# Patient Record
Sex: Female | Born: 1960 | Race: White | Hispanic: No | Smoking: Current every day smoker
Health system: Southern US, Community
[De-identification: ages and names within clinical notes are randomized; demographics above are authoritative.]

## PROBLEM LIST (undated history)

## (undated) DIAGNOSIS — Z87442 Personal history of urinary calculi: Secondary | ICD-10-CM

## (undated) DIAGNOSIS — N83209 Unspecified ovarian cyst, unspecified side: Secondary | ICD-10-CM

## (undated) DIAGNOSIS — L659 Nonscarring hair loss, unspecified: Secondary | ICD-10-CM

## (undated) DIAGNOSIS — T7840XA Allergy, unspecified, initial encounter: Secondary | ICD-10-CM

## (undated) DIAGNOSIS — M1990 Unspecified osteoarthritis, unspecified site: Secondary | ICD-10-CM

## (undated) DIAGNOSIS — N951 Menopausal and female climacteric states: Secondary | ICD-10-CM

## (undated) DIAGNOSIS — N809 Endometriosis, unspecified: Secondary | ICD-10-CM

## (undated) HISTORY — DX: Endometriosis, unspecified: N80.9

## (undated) HISTORY — DX: Allergy, unspecified, initial encounter: T78.40XA

## (undated) HISTORY — PX: BILATERAL OOPHORECTOMY: SHX1221

## (undated) HISTORY — PX: APPENDECTOMY: SHX54

## (undated) HISTORY — DX: Nonscarring hair loss, unspecified: L65.9

## (undated) HISTORY — DX: Personal history of urinary calculi: Z87.442

## (undated) HISTORY — DX: Unspecified ovarian cyst, unspecified side: N83.209

## (undated) HISTORY — PX: ENDOMETRIAL ABLATION: SHX621

## (undated) HISTORY — DX: Menopausal and female climacteric states: N95.1

## (undated) HISTORY — DX: Unspecified osteoarthritis, unspecified site: M19.90

## (undated) HISTORY — PX: WISDOM TOOTH EXTRACTION: SHX21

---

## 1994-04-10 HISTORY — PX: LAPAROSCOPIC HYSTERECTOMY: SHX1926

## 1999-08-16 ENCOUNTER — Other Ambulatory Visit: Admission: RE | Admit: 1999-08-16 | Discharge: 1999-08-16 | Payer: Self-pay | Admitting: Obstetrics & Gynecology

## 2002-07-14 ENCOUNTER — Encounter: Payer: Self-pay | Admitting: Emergency Medicine

## 2002-07-14 ENCOUNTER — Emergency Department (HOSPITAL_COMMUNITY): Admission: EM | Admit: 2002-07-14 | Discharge: 2002-07-14 | Payer: Self-pay | Admitting: *Deleted

## 2003-12-31 ENCOUNTER — Other Ambulatory Visit: Admission: RE | Admit: 2003-12-31 | Discharge: 2003-12-31 | Payer: Self-pay | Admitting: Obstetrics & Gynecology

## 2007-10-29 ENCOUNTER — Encounter: Admission: RE | Admit: 2007-10-29 | Discharge: 2007-10-29 | Payer: Self-pay | Admitting: Family Medicine

## 2009-01-21 ENCOUNTER — Emergency Department (HOSPITAL_COMMUNITY): Admission: EM | Admit: 2009-01-21 | Discharge: 2009-01-21 | Payer: Self-pay | Admitting: Family Medicine

## 2011-01-13 ENCOUNTER — Other Ambulatory Visit: Payer: Self-pay | Admitting: Obstetrics & Gynecology

## 2011-01-13 DIAGNOSIS — R928 Other abnormal and inconclusive findings on diagnostic imaging of breast: Secondary | ICD-10-CM

## 2011-01-27 ENCOUNTER — Ambulatory Visit
Admission: RE | Admit: 2011-01-27 | Discharge: 2011-01-27 | Disposition: A | Discharge status: Home / Self Care | Payer: PRIVATE HEALTH INSURANCE | Source: Ambulatory Visit | Attending: Obstetrics & Gynecology | Admitting: Obstetrics & Gynecology

## 2011-01-27 DIAGNOSIS — R928 Other abnormal and inconclusive findings on diagnostic imaging of breast: Secondary | ICD-10-CM

## 2014-03-18 ENCOUNTER — Other Ambulatory Visit: Payer: Self-pay | Admitting: Obstetrics & Gynecology

## 2014-03-18 DIAGNOSIS — R928 Other abnormal and inconclusive findings on diagnostic imaging of breast: Secondary | ICD-10-CM

## 2014-03-25 ENCOUNTER — Ambulatory Visit
Admission: RE | Admit: 2014-03-25 | Discharge: 2014-03-25 | Disposition: A | Discharge status: Home / Self Care | Payer: BC Managed Care – PPO | Source: Ambulatory Visit | Attending: Obstetrics & Gynecology | Admitting: Obstetrics & Gynecology

## 2014-03-25 DIAGNOSIS — R928 Other abnormal and inconclusive findings on diagnostic imaging of breast: Secondary | ICD-10-CM

## 2014-04-07 ENCOUNTER — Other Ambulatory Visit: Payer: PRIVATE HEALTH INSURANCE

## 2014-06-24 ENCOUNTER — Other Ambulatory Visit: Payer: Self-pay | Admitting: Family Medicine

## 2014-06-24 ENCOUNTER — Ambulatory Visit
Admission: RE | Admit: 2014-06-24 | Discharge: 2014-06-24 | Disposition: A | Discharge status: Home / Self Care | Payer: Self-pay | Source: Ambulatory Visit | Attending: Family Medicine | Admitting: Family Medicine

## 2014-06-24 DIAGNOSIS — R059 Cough, unspecified: Secondary | ICD-10-CM

## 2014-06-24 DIAGNOSIS — R05 Cough: Secondary | ICD-10-CM

## 2020-09-15 LAB — COLOGUARD
COLOGUARD: NEGATIVE
Cologuard: NEGATIVE

## 2021-07-06 ENCOUNTER — Telehealth: Payer: Self-pay | Admitting: Internal Medicine

## 2021-07-06 NOTE — Telephone Encounter (Signed)
That is okay, please advise that she needs to keep a gynecologist ?

## 2021-07-06 NOTE — Telephone Encounter (Signed)
Pt is the partner of Greer Ee and would like to establish care with Dr.Paz.  ?

## 2021-07-06 NOTE — Telephone Encounter (Signed)
Please advise 

## 2021-07-07 LAB — HM MAMMOGRAPHY

## 2021-07-07 NOTE — Telephone Encounter (Signed)
Lvm to call back

## 2021-07-20 DIAGNOSIS — R6882 Decreased libido: Secondary | ICD-10-CM | POA: Insufficient documentation

## 2021-07-20 DIAGNOSIS — N951 Menopausal and female climacteric states: Secondary | ICD-10-CM | POA: Insufficient documentation

## 2021-07-21 ENCOUNTER — Other Ambulatory Visit: Payer: Self-pay

## 2021-08-04 ENCOUNTER — Encounter: Payer: Self-pay | Admitting: Internal Medicine

## 2021-08-04 ENCOUNTER — Ambulatory Visit: Payer: BC Managed Care – PPO | Admitting: Internal Medicine

## 2021-08-04 VITALS — BP 108/64 | HR 68 | Temp 98.1°F | Resp 16 | Ht 64.5 in | Wt 132.4 lb

## 2021-08-04 DIAGNOSIS — Z8249 Family history of ischemic heart disease and other diseases of the circulatory system: Secondary | ICD-10-CM

## 2021-08-04 DIAGNOSIS — Z0000 Encounter for general adult medical examination without abnormal findings: Secondary | ICD-10-CM | POA: Diagnosis not present

## 2021-08-04 DIAGNOSIS — Z09 Encounter for follow-up examination after completed treatment for conditions other than malignant neoplasm: Secondary | ICD-10-CM | POA: Insufficient documentation

## 2021-08-04 LAB — LIPID PANEL
Cholesterol: 169 mg/dL (ref 0–200)
HDL: 61.4 mg/dL (ref >39.00)
LDL Cholesterol: 68 mg/dL (ref 0–99)
NonHDL: 107.39
Total CHOL/HDL Ratio: 3
Triglycerides: 195 mg/dL — ABNORMAL HIGH (ref 0.0–149.0)
VLDL: 39 mg/dL (ref 0.0–40.0)

## 2021-08-04 LAB — CBC WITH DIFFERENTIAL/PLATELET
Basophils Absolute: 0.1 10*3/uL (ref 0.0–0.1)
Basophils Relative: 0.9 % (ref 0.0–3.0)
Eosinophils Absolute: 0.1 10*3/uL (ref 0.0–0.7)
Eosinophils Relative: 0.9 % (ref 0.0–5.0)
HCT: 40.8 % (ref 36.0–46.0)
Hemoglobin: 13.7 g/dL (ref 12.0–15.0)
Lymphocytes Relative: 35.5 % (ref 12.0–46.0)
Lymphs Abs: 2.1 10*3/uL (ref 0.7–4.0)
MCHC: 33.6 g/dL (ref 30.0–36.0)
MCV: 92.2 fl (ref 78.0–100.0)
Monocytes Absolute: 0.4 10*3/uL (ref 0.1–1.0)
Monocytes Relative: 6.8 % (ref 3.0–12.0)
Neutro Abs: 3.4 10*3/uL (ref 1.4–7.7)
Neutrophils Relative %: 55.9 % (ref 43.0–77.0)
Platelets: 265 10*3/uL (ref 150.0–400.0)
RBC: 4.42 Mil/uL (ref 3.87–5.11)
RDW: 13.5 % (ref 11.5–15.5)
WBC: 6 10*3/uL (ref 4.0–10.5)

## 2021-08-04 LAB — COMPREHENSIVE METABOLIC PANEL
ALT: 19 U/L (ref 0–35)
AST: 21 U/L (ref 0–37)
Albumin: 4.4 g/dL (ref 3.5–5.2)
Alkaline Phosphatase: 45 U/L (ref 39–117)
BUN: 12 mg/dL (ref 6–23)
CO2: 29 mEq/L (ref 19–32)
Calcium: 9.4 mg/dL (ref 8.4–10.5)
Chloride: 101 mEq/L (ref 96–112)
Creatinine, Ser: 0.58 mg/dL (ref 0.40–1.20)
GFR: 97 mL/min (ref >60.00)
Glucose, Bld: 89 mg/dL (ref 70–99)
Potassium: 4.4 mEq/L (ref 3.5–5.1)
Sodium: 137 mEq/L (ref 135–145)
Total Bilirubin: 0.4 mg/dL (ref 0.2–1.2)
Total Protein: 7 g/dL (ref 6.0–8.3)

## 2021-08-04 LAB — TSH: TSH: 1.81 u[IU]/mL (ref 0.35–5.50)

## 2021-08-04 NOTE — Assessment & Plan Note (Signed)
Td 2016 ?S/p shingrix x 2 ?Covid vax utd ?Last female exam  06-2021. ?Mammogram 07-07-21: Normal ?CCS: ?Cscope at age 61: no polyps per pt, no report   ?Cologuard : NEG 09/2020 ?Diet, exercise: She is active, takes walks regularly.  Counseled about diet ?ACP info provided  ?Labs: CMP, FLP, CBC, TSH  ?  ?

## 2021-08-04 NOTE — Telephone Encounter (Signed)
Copy of form sent for scanning,.  ?

## 2021-08-04 NOTE — Assessment & Plan Note (Signed)
-  New patient, here for CPX. ?-She brought records, they were reviewed. ?Decreased libido: On testosterone per gynecology ?+ FH CAD: ?Mother had CAD late in life, father had heart attack at age 63.  The patient is active without chest pain but is a smoker. ?Does not recall any heart testing previously. ?EKG today: NSR ?D/w pt Coronary Ca Score: will proceed ?Tobacco abuse: Has tried to quit tobacco many times, could not tolerate Chantix.  Counseled. ?Stress: Work-related, very seldom takes Xanax ?RTC CPX 1 year  ?

## 2021-08-04 NOTE — Progress Notes (Signed)
? ?Subjective:  ? ? Patient ID: Kendra Shaw, female    DOB: 15-Jul-1960, 61 y.o.   MRN: [id number] ? ?DOS:  08/04/2021 ?Type of visit - description: new patient, CPX ? ?Here for CPX ?In general feels well. ?She did have COVID over a year ago, reports symptoms were severe and  since then she has slightly fatigue. ?Admits to occasional cough mostly allergy related. ?Denies nausea vomiting.  No blood in the stools. ?Occasional GERD, takes OTCs. ? ? ?Review of Systems ? ?Other than above, a 14 point review of systems is negative  ? ?  ? ?Past Medical History:  ?Diagnosis Date  ? Allergy   ? Alopecia   ? Arthritis   ? Endometriosis   ? History of kidney stones   ? Menopausal symptoms   ? Ovarian cyst   ? ? ?Past Surgical History:  ?Procedure Laterality Date  ? APPENDECTOMY    ? BILATERAL OOPHORECTOMY    ? ENDOMETRIAL ABLATION    ? LAPAROSCOPIC HYSTERECTOMY  1996  ? WISDOM TOOTH EXTRACTION    ? ?Social History  ? ?Socioeconomic History  ? Marital status: Media planner  ?  Spouse name: Not on file  ? Number of children: 0  ? Years of education: Not on file  ? Highest education level: Not on file  ?Occupational History  ? Occupation: part time- [employer]  ?Tobacco Use  ? Smoking status: Every Day  ?  Packs/day: 0.75  ?  Years: 34.00  ?  Pack years: 25.50  ?  Types: Cigarettes  ? Smokeless tobacco: Never  ?Substance and Sexual Activity  ? Alcohol use: Yes  ?  Comment: socially  ? Drug use: Not on file  ? Sexual activity: Not on file  ?Other Topics Concern  ? Not on file  ?Social History Narrative  ? Lives w/ significant other   ? ?Social Determinants of Health  ? ?Financial Resource Strain: Not on file  ?Food Insecurity: Not on file  ?Transportation Needs: Not on file  ?Physical Activity: Not on file  ?Stress: Not on file  ?Social Connections: Not on file  ?Intimate Partner Violence: Not on file  ? ? ?Current Outpatient Medications  ?Medication Instructions  ? albuterol (VENTOLIN HFA) 108 (90 Base) MCG/ACT inhaler 2 puffs,  Every 4 hours PRN  ? ALPRAZolam (XANAX) 0.125-0.25 mg, Oral, Daily PRN  ? cetirizine (ZYRTEC) 10 mg, Oral, Daily  ? estradiol (ESTRACE) 2 mg, Oral, Daily  ? guaiFENesin (MUCINEX) 600 MG 12 hr tablet Oral  ? Multiple Vitamins-Minerals (AIRBORNE GUMMIES PO) Oral  ? Probiotic Product (PROBIOTIC DAILY PO) Probiotic  ? Probiotic Product (PROBIOTIC DAILY PO) Oral  ? testosterone cypionate (DEPOTESTOSTERONE CYPIONATE) 200 MG/ML injection   ? triamcinolone cream (KENALOG) 0.1 % 1 application., 2 times daily  ? ? ?   ?Objective:  ? Physical Exam ?BP 108/64 (BP Location: Left Arm, Patient Position: Sitting, Cuff Size: Small)   Pulse 68   Temp 98.1 ?F (36.7 ?C) (Oral)   Resp 16   Ht 5' 4.5" (1.638 m)   Wt 132 lb 6 oz (60 kg)   SpO2 97%   BMI 22.37 kg/m?  ?General: ?Well developed, NAD, BMI noted ?Neck: No  thyromegaly  ?HEENT:  ?Normocephalic . Face symmetric, atraumatic ?Lungs:  ?CTA B ?Normal respiratory effort, no intercostal retractions, no accessory muscle use. ?Heart: RRR,  no murmur.  ?Abdomen:  ?Not distended, soft, non-tender. No rebound or rigidity.   ?Lower extremities: no pretibial edema bilaterally  ?Skin:  Exposed areas without rash. Not pale. Not jaundice ?Neurologic:  ?alert & oriented X3.  ?Speech normal, gait appropriate for age and unassisted ?Strength symmetric and appropriate for age.  ?Psych: ?Cognition and judgment appear intact.  ?Cooperative with normal attention span and concentration.  ?Behavior appropriate. ?No anxious or depressed appearing. ? ?   ?Assessment   ? ?Assessment (new patient 07-2021, referred by Leeann Must). ?Decreased libido ?Endometriosis, hysterectomy, B oophorectomy ?+FH CAD ? ?PLAN ?-New patient, here for CPX. ?-She brought records, they were reviewed. ?Decreased libido: On testosterone per gynecology ?+ FH CAD: ?Mother had CAD late in life, father had heart attack at age 46.  The patient is active without chest pain but is a smoker. ?Does not recall any heart testing  previously. ?EKG today: NSR ?D/w pt Coronary Ca Score: will proceed ?Tobacco abuse: Has tried to quit tobacco many times, could not tolerate Chantix.  Counseled. ?Stress: Work-related, very seldom takes Xanax ?RTC CPX 1 year  ? ? ?

## 2021-08-04 NOTE — Patient Instructions (Signed)
   GO TO THE LAB : Get the blood work     GO TO THE FRONT DESK, PLEASE SCHEDULE YOUR APPOINTMENTS Come back for   a physical exam in 1 year   "Living will", "Health Care Power of attorney": Advanced care planning  (If you already have a living will or healthcare power of attorney, please bring the copy to be scanned in your chart.)  Advance care planning is a process that supports adults in  understanding and sharing their preferences regarding future medical care.   The patient's preferences are recorded in documents called Advance Directives.    Advanced directives are completed (and can be modified at any time) while the patient is in full mental capacity.   The documentation should be available at all times to the patient, the family and the healthcare providers.  Bring in a copy to be scanned in your chart is an excellent idea and is recommended   This legal documents direct treatment decision making and/or appoint a surrogate to make the decision if the patient is not capable to do so.    Advance directives can be documented in many types of formats,  documents have names such as:  Lliving will  Durable power of attorney for healthcare (healthcare proxy or healthcare power of attorney)  Combined directives  Physician orders for life-sustaining treatment    More information at:  [email or web address]  

## 2021-08-25 ENCOUNTER — Ambulatory Visit (HOSPITAL_BASED_OUTPATIENT_CLINIC_OR_DEPARTMENT_OTHER)
Admission: RE | Admit: 2021-08-25 | Discharge: 2021-08-25 | Disposition: A | Discharge status: Home / Self Care | Payer: BC Managed Care – PPO | Source: Ambulatory Visit | Attending: Internal Medicine | Admitting: Internal Medicine

## 2021-08-25 DIAGNOSIS — Z8249 Family history of ischemic heart disease and other diseases of the circulatory system: Secondary | ICD-10-CM | POA: Insufficient documentation

## 2021-08-29 ENCOUNTER — Telehealth: Payer: Self-pay | Admitting: Internal Medicine

## 2021-08-29 DIAGNOSIS — R911 Solitary pulmonary nodule: Secondary | ICD-10-CM

## 2021-08-29 DIAGNOSIS — F17200 Nicotine dependence, unspecified, uncomplicated: Secondary | ICD-10-CM

## 2021-08-29 NOTE — Telephone Encounter (Signed)
Arrange a noncontrast chest CT, DX lung nodule, smoker. === Spoke with the patient today regards results: - Lung nodule, recommend to proceed with a dedicated CT chest, need to rule out a malignancy, she agreed. - Calcium coronary score 8, 66% percentile, last LDL 68.  Recommend to quit tobacco, discussed statins, she will think about it

## 2021-08-29 NOTE — Telephone Encounter (Signed)
CT ordered. 

## 2021-09-07 ENCOUNTER — Ambulatory Visit (HOSPITAL_BASED_OUTPATIENT_CLINIC_OR_DEPARTMENT_OTHER)
Admission: RE | Admit: 2021-09-07 | Discharge: 2021-09-07 | Disposition: A | Discharge status: Home / Self Care | Payer: BC Managed Care – PPO | Source: Ambulatory Visit | Attending: Internal Medicine | Admitting: Internal Medicine

## 2021-09-07 DIAGNOSIS — F17200 Nicotine dependence, unspecified, uncomplicated: Secondary | ICD-10-CM | POA: Diagnosis present

## 2021-09-07 DIAGNOSIS — R911 Solitary pulmonary nodule: Secondary | ICD-10-CM | POA: Diagnosis present

## 2021-09-12 NOTE — Addendum Note (Signed)
Addended by: Conrad  D on: 09/12/2021 08:00 AM   Modules accepted: Orders

## 2021-10-06 ENCOUNTER — Ambulatory Visit: Payer: BC Managed Care – PPO | Admitting: Emergency Medicine

## 2021-10-06 ENCOUNTER — Encounter: Payer: Self-pay | Admitting: Emergency Medicine

## 2021-10-06 DIAGNOSIS — R911 Solitary pulmonary nodule: Secondary | ICD-10-CM

## 2021-10-06 NOTE — Addendum Note (Signed)
Addended by: Jaynee Eagles on: 10/06/2021 10:36 AM   Modules accepted: Orders

## 2021-10-06 NOTE — Progress Notes (Signed)
Subjective:    Patient ID: Kendra Shaw, female    DOB: 10/20/1960, 61 y.o.   MRN: [id number]  HPI 61 year old smoker (45 pack years) with a history of endometriosis, allergic rhinitis, alopecia.  She is here to discuss an abnormal CT scan of the chest.  She underwent a cardiac calcium scoring CT on 08/25/2021 that identified a mildly lobulated noncalcified left lower lobe pulmonary nodule 1.3 x 1.1 cm.  This prompted a dedicated CT as below She feels well, no sx. No cough or dyspnea. She has tried to stop smoking without success.   CT chest 09/07/2021 reviewed by me shows no mediastinal or axillary adenopathy, confirms a 1.2 x 1.0 cm left lower lobe solid pulmonary nodule.   Review of Systems As per HPI  Past Medical History:  Diagnosis Date   Allergy    Alopecia    Arthritis    Endometriosis    History of kidney stones    Menopausal symptoms    Ovarian cyst      Family History  Problem Relation Age of Onset   Arthritis Mother    Heart attack Mother 59   Hyperlipidemia Mother    CVA Mother    Kidney Stones Father    Heart attack Father 14   Alcohol abuse Father    Arthritis Father    CAD Father 30   Asthma Brother    Depression Brother    Early death Brother    Arthritis Brother    Colon cancer Maternal Aunt    Breast cancer Paternal Aunt 106   Breast cancer Cousin        mother side    No hx lung CA.   Social History   Socioeconomic History   Marital status: Media planner    Spouse name: Not on file   Number of children: 0   Years of education: Not on file   Highest education level: Not on file  Occupational History   Occupation: part time- [employer]  Tobacco Use   Smoking status: Every Day    Packs/day: 0.75    Years: 60.00    Total pack years: 45.00    Types: Cigarettes   Smokeless tobacco: Never   Tobacco comments:    Pt reports cutting back 6-7 cigarettes a day   Substance and Sexual Activity   Alcohol use: Yes    Comment: socially   Drug  use: Not on file   Sexual activity: Not on file  Other Topics Concern   Not on file  Social History Narrative   Lives w/ significant other    Social Determinants of Health   Financial Resource Strain: Not on file  Food Insecurity: Not on file  Transportation Needs: Not on file  Physical Activity: Not on file  Stress: Not on file  Social Connections: Not on file  Intimate Partner Violence: Not on file     Allergies  Allergen Reactions   Codeine Other (See Comments)   Neomycin-Bacitracin Zn-Polymyx     Other reaction(s): Other (See Comments)   Nitrofurantoin Other (See Comments)   Ofloxacin Other (See Comments)   Penicillins Other (See Comments)   Propoxyphene Other (See Comments)     Outpatient Medications Prior to Visit  Medication Sig Dispense Refill   ALPRAZolam (XANAX) 0.25 MG tablet Take 0.125-0.25 mg by mouth daily as needed.     cetirizine (ZYRTEC) 10 MG tablet Take 10 mg by mouth daily.     estradiol (ESTRACE) 2 MG tablet  Take 2 mg by mouth daily.     guaiFENesin (MUCINEX) 600 MG 12 hr tablet Take by mouth.     Multiple Vitamins-Minerals (AIRBORNE GUMMIES PO) Take by mouth.     Probiotic Product (PROBIOTIC DAILY PO) Take by mouth.     testosterone cypionate (DEPOTESTOSTERONE CYPIONATE) 200 MG/ML injection      triamcinolone cream (KENALOG) 0.1 % Apply 1 application  topically in the morning and at bedtime. Rarely uses     Probiotic Product (PROBIOTIC DAILY PO) Probiotic     albuterol (VENTOLIN HFA) 108 (90 Base) MCG/ACT inhaler Inhale 2 puffs into the lungs every 4 (four) hours as needed. (Patient not taking: Reported on 08/04/2021)     No facility-administered medications prior to visit.         Objective:   Physical Exam Vitals:   10/06/21 0932  BP: 112/68  Pulse: 86  SpO2: 95%  Weight: 132 lb 6.4 oz (60.1 kg)  Height: 5' 4.5" (1.638 m)   Gen: Pleasant, well-nourished, in no distress,  normal affect  ENT: No lesions,  mouth clear,  oropharynx clear,  no postnasal drip  Neck: No JVD, no stridor  Lungs: No use of accessory muscles, no crackles or wheezing on normal respiration, no wheeze on forced expiration  Cardiovascular: RRR, heart sounds normal, no murmur or gallops, no peripheral edema  Musculoskeletal: No deformities, no cyanosis or clubbing  Neuro: alert, awake, non focal  Skin: Warm, no lesions or rash     Assessment & Plan:  Pulmonary nodule Reviewed the CT with her today.  The nodule is rounded, solitary.  At least moderate risk given her tobacco history.  She may be a good candidate for primary resection given her overall functional capacity.  She needs pulmonary function testing and a PET scan.  Depending on results we will decide whether to refer to thoracic surgery or to consider navigational bronchoscopy.  Also explained to her the clinical trial regarding nodule risk stratification.  She is interested in hearing about this.  We will perform a PET scan We will perform pulmonary function testing We discussed participation in a clinical trial aimed at helping to risk stratify pulmonary nodules Follow Dr. Delton Coombes next available after your studies are completed so we can review and plan next steps   Levy Pupa, MD, PhD 10/06/2021, 10:14 AM [facility] [phone] or if no answer before 7:00PM call [phone] For any issues after 7:00PM please call eLink [phone]

## 2021-10-06 NOTE — Assessment & Plan Note (Signed)
Reviewed the CT with her today.  The nodule is rounded, solitary.  At least moderate risk given her tobacco history.  She may be a good candidate for primary resection given her overall functional capacity.  She needs pulmonary function testing and a PET scan.  Depending on results we will decide whether to refer to thoracic surgery or to consider navigational bronchoscopy.  Also explained to her the clinical trial regarding nodule risk stratification.  She is interested in hearing about this.  We will perform a PET scan We will perform pulmonary function testing We discussed participation in a clinical trial aimed at helping to risk stratify pulmonary nodules Follow Dr. Delton Coombes next available after your studies are completed so we can review and plan next steps

## 2021-10-06 NOTE — Patient Instructions (Signed)
We will perform a PET scan We will perform pulmonary function testing We discussed participation in a clinical trial aimed at helping to risk stratify pulmonary nodules Follow Dr. Delton Coombes next available after your studies are completed so we can review and plan next steps

## 2021-10-21 ENCOUNTER — Encounter (HOSPITAL_COMMUNITY)
Admission: RE | Admit: 2021-10-21 | Discharge: 2021-10-21 | Disposition: A | Discharge status: Home / Self Care | Payer: BC Managed Care – PPO | Source: Ambulatory Visit | Attending: Emergency Medicine | Admitting: Emergency Medicine

## 2021-10-21 DIAGNOSIS — R911 Solitary pulmonary nodule: Secondary | ICD-10-CM | POA: Insufficient documentation

## 2021-10-21 LAB — GLUCOSE, CAPILLARY: Glucose-Capillary: 100 mg/dL — ABNORMAL HIGH (ref 70–99)

## 2021-10-21 MED ORDER — FLUDEOXYGLUCOSE F - 18 (FDG) INJECTION
6.6000 | Freq: Once | INTRAVENOUS | Status: AC | PRN
Start: 2021-10-21 — End: 2021-10-21
  Administered 2021-10-21: 6.6 via INTRAVENOUS

## 2021-11-10 ENCOUNTER — Telehealth: Payer: Self-pay | Admitting: Primary Care

## 2021-11-10 NOTE — Telephone Encounter (Signed)
Kendra Peer, MD  11/02/2021  3:51 PM EDT     Please let the patient know that her PET scan does not show any evidence of activity in her pulmonary nodule or elsewhere. I would like to follow up w her in office to talk about next steps      Called and spoke with pt letting her know the results of the PET and she verbalized understanding. Pt has a f/u scheduled with BW. Nothing further needed.

## 2021-11-24 ENCOUNTER — Ambulatory Visit (INDEPENDENT_AMBULATORY_CARE_PROVIDER_SITE_OTHER): Payer: BC Managed Care – PPO | Admitting: Emergency Medicine

## 2021-11-24 ENCOUNTER — Encounter: Payer: Self-pay | Admitting: Primary Care

## 2021-11-24 ENCOUNTER — Ambulatory Visit: Payer: BC Managed Care – PPO | Admitting: Primary Care

## 2021-11-24 DIAGNOSIS — R911 Solitary pulmonary nodule: Secondary | ICD-10-CM

## 2021-11-24 DIAGNOSIS — Z720 Tobacco use: Secondary | ICD-10-CM | POA: Diagnosis not present

## 2021-11-24 NOTE — Assessment & Plan Note (Addendum)
-   PET scan 10/21/21 showed LLL pulmonary nodules measuring 36mm was non-hypermetatolic consistent with benign finding. Recommend repeat CT chest wo contrast in 6 months to assess for change. Pulmonary function testing today was normal. No obstructive process and lung volumes were normal. FU in 6 months or sooner if needed.

## 2021-11-24 NOTE — Patient Instructions (Signed)
Pulmonary function testing was normal  PET scan showed no activity within left lower lobe nodule  Recommend repeating CT chest in 6 months to monitor for changes in size to nodule   Strongly encourage you quit smoking, taper amount and pick quit date   Follow-up: 6 months with Dr. Delton Coombes after CT scan

## 2021-11-24 NOTE — Assessment & Plan Note (Signed)
-   Counseling on smoking cessation provided. She has tried chantix, wellbutrin, nicotine gum and nicotine patches in the past without help. She is currently down to 4 cigarettes a day. She is motivated toq uit smoking. Encourage she taper amount she is smoking and pick quit date.

## 2021-11-24 NOTE — Progress Notes (Signed)
Full PFT performed today. °

## 2021-11-24 NOTE — Patient Instructions (Signed)
Full PFT performed today. °

## 2021-11-24 NOTE — Progress Notes (Signed)
@Patient  ID: , female    DOB: May 24, 1960, 61 y.o.   MRN: [id number]  Chief Complaint  Patient presents with   Follow-up    F/u after PFT    Referring provider: [id number], MD  HPI: 61 year old female, current every day smoker (45 pack year hx). PMH significant for pulmonary nodule,menopausal symptom. Patient of Dr. 68, seen for initial consult on 10/06/21.   Previous [facility] pulmonary encounter: 10/06/21- Dr. 10/08/21, consult  61 year old smoker (45 pack years) with a history of endometriosis, allergic rhinitis, alopecia.  She is here to discuss an abnormal CT scan of the chest.  She underwent a cardiac calcium scoring CT on 08/25/2021 that identified a mildly lobulated noncalcified left lower lobe pulmonary nodule 1.3 x 1.1 cm.  This prompted a dedicated CT as below She feels well, no sx. No cough or dyspnea. She has tried to stop smoking without success.   CT chest 09/07/2021 reviewed by me shows no mediastinal or axillary adenopathy, confirms a 1.2 x 1.0 cm left lower lobe solid pulmonary nodule.   11/24/2021- Inerim hx  Patient presents today following PET scan and Pulmonary function testing. PET scan showed left lower lobe pulmonary nodule measuring 57mm was non hypermetabolic consistent with benign/indolent finding. No abnormal hypermetabolic activity in the neck, chest, abdomen or pelvis. Her pulmonary function testing today showed normal lung function. She has no acute respiratory complaints. She is trying to cut back on her smoking. She did not tolerate chantix or wellbutrin. She has tried both nicotine patches and gum. She is down to 4 cigarettes a day. She is motivated to pick a quit date.   Pulmonary test: 11/24/21 PFTs>> FVC 2.45 (73%), FEV1 1.98 (77%), ratio 81, TLC 93%, DLCO 17.83 (87%) Normal lung volumes. Normal diffusion capacity.   Allergies  Allergen Reactions   Codeine Other (See Comments)   Neomycin-Bacitracin Zn-Polymyx     Other reaction(s): Other (See  Comments)   Nitrofurantoin Other (See Comments)   Ofloxacin Other (See Comments)   Penicillins Other (See Comments)   Propoxyphene Other (See Comments)    Immunization History  Administered Date(s) Administered   Influenza-Unspecified 01/08/2014, 12/10/2014, 01/02/2016   PFIZER(Purple Top)SARS-COV-2 Vaccination 07/03/2019, 07/29/2019, 01/30/2020, 07/21/2020   Pfizer Covid-19 Vaccine Bivalent Booster 57yrs & up 12/17/2020   Tdap 01/02/2015   Zoster Recombinat (Shingrix) 01/02/2015, 03/02/2017   Zoster, Live 01/02/2016    Past Medical History:  Diagnosis Date   Allergy    Alopecia    Arthritis    Endometriosis    History of kidney stones    Menopausal symptoms    Ovarian cyst     Tobacco History: Social History   Tobacco Use  Smoking Status Every Day   Packs/day: 0.75   Years: 60.00   Total pack years: 45.00   Types: Cigarettes  Smokeless Tobacco Never  Tobacco Comments   Pt reports cutting back 6-7 cigarettes a day    Ready to quit: Not Answered Counseling given: Not Answered Tobacco comments: Pt reports cutting back 6-7 cigarettes a day    Outpatient Medications Prior to Visit  Medication Sig Dispense Refill   cetirizine (ZYRTEC) 10 MG tablet Take 10 mg by mouth daily.     estradiol (ESTRACE) 2 MG tablet Take 2 mg by mouth daily.     guaiFENesin (MUCINEX) 600 MG 12 hr tablet Take by mouth.     Multiple Vitamins-Minerals (AIRBORNE GUMMIES PO) Take by mouth.     Probiotic Product (PROBIOTIC  DAILY PO) Take by mouth.     testosterone cypionate (DEPOTESTOSTERONE CYPIONATE) 200 MG/ML injection      triamcinolone cream (KENALOG) 0.1 % Apply 1 application  topically in the morning and at bedtime. Rarely uses     ALPRAZolam (XANAX) 0.25 MG tablet Take 0.125-0.25 mg by mouth daily as needed. (Patient not taking: Reported on 11/24/2021)     Probiotic Product (PROBIOTIC DAILY PO) Probiotic     No facility-administered medications prior to visit.      Review of  Systems  Review of Systems  Constitutional: Negative.   HENT: Negative.    Respiratory: Negative.    Cardiovascular: Negative.      Physical Exam  BP 118/64 (BP Location: Left Arm)   Pulse 73   Temp 97.7 F (36.5 C) (Temporal)   Ht 5' 4.5" (1.638 m)   Wt 132 lb (59.9 kg)   SpO2 97%   BMI 22.31 kg/m  Physical Exam Constitutional:      Appearance: Normal appearance.  HENT:     Head: Normocephalic and atraumatic.  Cardiovascular:     Rate and Rhythm: Normal rate and regular rhythm.  Pulmonary:     Effort: Pulmonary effort is normal.     Breath sounds: Normal breath sounds.  Skin:    General: Skin is warm and dry.  Neurological:     General: No focal deficit present.     Mental Status: She is alert and oriented to person, place, and time. Mental status is at baseline.  Psychiatric:        Mood and Affect: Mood normal.        Behavior: Behavior normal.        Thought Content: Thought content normal.        Judgment: Judgment normal.      Lab Results:  CBC    Component Value Date/Time   WBC 6.0 08/04/2021 0901   RBC 4.42 08/04/2021 0901   HGB 13.7 08/04/2021 0901   HCT 40.8 08/04/2021 0901   PLT 265.0 08/04/2021 0901   MCV 92.2 08/04/2021 0901   MCHC 33.6 08/04/2021 0901   RDW 13.5 08/04/2021 0901   LYMPHSABS 2.1 08/04/2021 0901   MONOABS 0.4 08/04/2021 0901   EOSABS 0.1 08/04/2021 0901   BASOSABS 0.1 08/04/2021 0901    BMET    Component Value Date/Time   NA 137 08/04/2021 0901   K 4.4 08/04/2021 0901   CL 101 08/04/2021 0901   CO2 29 08/04/2021 0901   GLUCOSE 89 08/04/2021 0901   BUN 12 08/04/2021 0901   CREATININE 0.58 08/04/2021 0901   CALCIUM 9.4 08/04/2021 0901    BNP No results found for: "BNP"  ProBNP No results found for: "PROBNP"  Imaging: No results found.   Assessment & Plan:   Pulmonary nodule - PET scan 10/21/21 showed LLL pulmonary nodules measuring 52mm was non-hypermetatolic consistent with benign finding. Recommend  repeat CT chest wo contrast in 6 months to assess for change. Pulmonary function testing today was normal. No obstructive process and lung volumes were normal. FU in 6 months or sooner if needed.   Tobacco abuse - Counseling on smoking cessation provided. She has tried chantix, wellbutrin, nicotine gum and nicotine patches in the past without help. She is currently down to 4 cigarettes a day. She is motivated toq uit smoking. Encourage she taper amount she is smoking and pick quit date.      Glenford Bayley, NP 11/24/2021

## 2021-11-28 ENCOUNTER — Telehealth: Payer: Self-pay | Admitting: Emergency Medicine

## 2021-11-28 NOTE — Telephone Encounter (Signed)
Called and spoke to patient and she states that she has had a recent CT scan done in end of June and she is wondering if she could wait a year from that date to get another CT scan. She states that financial wise it is really expensive to get another CT done in January.  Are you ok with waiting for June 2024 to get another CT scan sir?  Please advise

## 2021-12-02 LAB — PULMONARY FUNCTION TEST
DL/VA % pred: 103 %
DL/VA: 4.34 ml/min/mmHg/L
DLCO cor % pred: 87 %
DLCO cor: 17.83 ml/min/mmHg
DLCO unc % pred: 87 %
DLCO unc: 17.83 ml/min/mmHg
FEF 25-75 Post: 1.84 L/sec
FEF 25-75 Pre: 1.3 L/sec
FEF2575-%Change-Post: 41 %
FEF2575-%Pred-Post: 80 %
FEF2575-%Pred-Pre: 56 %
FEV1-%Change-Post: 9 %
FEV1-%Pred-Post: 77 %
FEV1-%Pred-Pre: 70 %
FEV1-Post: 1.98 L
FEV1-Pre: 1.81 L
FEV1FVC-%Change-Post: 9 %
FEV1FVC-%Pred-Pre: 94 %
FEV6-%Change-Post: 0 %
FEV6-%Pred-Post: 76 %
FEV6-%Pred-Pre: 76 %
FEV6-Post: 2.45 L
FEV6-Pre: 2.45 L
FEV6FVC-%Pred-Post: 103 %
FEV6FVC-%Pred-Pre: 103 %
FVC-%Change-Post: 0 %
FVC-%Pred-Post: 73 %
FVC-%Pred-Pre: 74 %
FVC-Post: 2.45 L
FVC-Pre: 2.45 L
Post FEV1/FVC ratio: 81 %
Post FEV6/FVC ratio: 100 %
Pre FEV1/FVC ratio: 74 %
Pre FEV6/FVC Ratio: 100 %
RV % pred: 117 %
RV: 2.4 L
TLC % pred: 93 %
TLC: 4.77 L

## 2021-12-02 NOTE — Telephone Encounter (Signed)
If she would like tlo defer the CT chest to June 2024 then I am ok with that plan.

## 2021-12-08 NOTE — Telephone Encounter (Signed)
Attempted to call pt but unable to reach. Left detailed message letting her know the response from RB.  Routing to [facility] as pt's CT will need to be rescheduled until 09/2022.

## 2022-01-11 ENCOUNTER — Other Ambulatory Visit: Payer: Self-pay

## 2022-01-11 DIAGNOSIS — Z1159 Encounter for screening for other viral diseases: Secondary | ICD-10-CM

## 2022-01-11 DIAGNOSIS — Z114 Encounter for screening for human immunodeficiency virus [HIV]: Secondary | ICD-10-CM

## 2022-01-16 ENCOUNTER — Other Ambulatory Visit: Payer: BC Managed Care – PPO

## 2022-01-26 ENCOUNTER — Other Ambulatory Visit: Payer: BC Managed Care – PPO

## 2022-01-27 ENCOUNTER — Telehealth: Payer: Self-pay | Admitting: Family Medicine

## 2022-01-27 ENCOUNTER — Ambulatory Visit (INDEPENDENT_AMBULATORY_CARE_PROVIDER_SITE_OTHER): Payer: BC Managed Care – PPO | Admitting: Family Medicine

## 2022-01-27 VITALS — BP 111/66 | HR 85 | Temp 98.0°F | Ht 64.5 in | Wt 130.8 lb

## 2022-01-27 DIAGNOSIS — R1031 Right lower quadrant pain: Secondary | ICD-10-CM

## 2022-01-27 DIAGNOSIS — K5792 Diverticulitis of intestine, part unspecified, without perforation or abscess without bleeding: Secondary | ICD-10-CM

## 2022-01-27 MED ORDER — AMOXICILLIN-POT CLAVULANATE 875-125 MG PO TABS: 1.0000 | ORAL_TABLET | Freq: Two times a day (BID) | ORAL | 0 refills | Status: DC

## 2022-01-27 MED ORDER — TRAMADOL HCL 50 MG PO TABS: ORAL_TABLET | ORAL | 0 refills | Status: DC

## 2022-01-27 NOTE — Addendum Note (Signed)
Addended by: Tammi Sou on: 01/27/2022 04:30 PM   Modules accepted: Orders

## 2022-01-27 NOTE — Progress Notes (Signed)
OFFICE VISIT  01/27/2022  CC:  Chief Complaint  Patient presents with   Abdominal Pain    Hx of kidney stones, had complete hysterectomy. She gets terrrible indigestion when eating greasy food. Has has nausea off/ on. She saw Dr.Borden at [facility]; UA and CT scan completed. No kidney stones noted. Dr.Borden believes she may be constipated.    Back Pain    Patient is a 61 y.o. female who presents for right lower abdominal pain.  HPI: Onset upon waking up 2 mornings ago.  Severe right lower quadrant pain.  No fever. Brief nausea in the days preceding the pain but this has gone away.  No vomiting.  Her appetite is a little diminished. She has had no fever.  No diarrhea or constipation.  Stool has looked normal/brown.  No hip pain.  The pain is worse with sitting down. She has some diffuse low back pain on the day of onset of her abdominal pain but this has resolved.  No flank pain or upper abdominal pain. She has a history of kidney stones.  She went to the urologist 2 days ago and was told that her urine was normal and CT scan did not show any stones. No records available at this time.  She has a remote history of appendectomy and TAH/BSO.  ROS as above, plus--> no CP, no SOB, no wheezing, no cough, no dizziness, no HAs, no rashes, no melena/hematochezia.  No polyuria or polydipsia.  No myalgias or arthralgias.  No focal weakness, paresthesias, or tremors.  No acute vision or hearing abnormalities.  No dysuria or unusual/new urinary urgency or frequency.  No recent changes in lower legs. No palpitations.    Past Medical History:  Diagnosis Date   Allergy    Alopecia    Arthritis    Endometriosis    History of kidney stones    Menopausal symptoms    Ovarian cyst     Past Surgical History:  Procedure Laterality Date   APPENDECTOMY     BILATERAL OOPHORECTOMY     ENDOMETRIAL ABLATION     LAPAROSCOPIC HYSTERECTOMY  1996   WISDOM TOOTH EXTRACTION      Outpatient  Medications Prior to Visit  Medication Sig Dispense Refill   ALPRAZolam (XANAX) 0.25 MG tablet Take 0.125-0.25 mg by mouth daily as needed.     cetirizine (ZYRTEC) 10 MG tablet Take 10 mg by mouth daily.     estradiol (ESTRACE) 2 MG tablet Take 2 mg by mouth daily.     guaiFENesin (MUCINEX) 600 MG 12 hr tablet Take by mouth daily.     Multiple Vitamins-Minerals (AIRBORNE GUMMIES PO) Take by mouth.     Probiotic Product (PROBIOTIC DAILY PO) Take by mouth.     testosterone cypionate (DEPOTESTOSTERONE CYPIONATE) 200 MG/ML injection      triamcinolone cream (KENALOG) 0.1 % Apply 1 application  topically in the morning and at bedtime. Rarely uses     Vitamin D, Cholecalciferol, 25 MCG (1000 UT) CAPS Take by mouth once a week.     Probiotic Product (PROBIOTIC DAILY PO) Probiotic     No facility-administered medications prior to visit.    Allergies  Allergen Reactions   Codeine Other (See Comments)   Neomycin-Bacitracin Zn-Polymyx     Other reaction(s): Other (See Comments)   Nitrofurantoin Other (See Comments)   Ofloxacin Other (See Comments)   Penicillins Other (See Comments)   Propoxyphene Other (See Comments)    ROS As per HPI  PE:  01/27/2022    2:02 PM 11/24/2021   12:01 PM 10/06/2021    9:32 AM  Vitals with BMI  Height 5' 4.5" 5' 4.5" 5' 4.5"  Weight 130 lbs 13 oz 132 lbs 132 lbs 6 oz  BMI 22.11 [id number]  Systolic [id number]  Diastolic 66 64 68  Pulse 85 73 86   Physical Exam  Exam chaperoned by Deveron Furlong, CMA. General: Alert, walking around in pain but does not appear acutely ill. Affect is anxious. Cardiovascular: Regular rhythm and rate without murmur rub or gallop. Lungs are clear bilaterally, nonlabored breathing. Abdomen is soft and nondistended.  She has significant tenderness to palpation in the right lower quadrant, no involuntary guarding, no rebound tenderness. No tenderness in the right hip.   LABS:  Last CBC Lab Results  Component Value  Date   WBC 6.0 08/04/2021   HGB 13.7 08/04/2021   HCT 40.8 08/04/2021   MCV 92.2 08/04/2021   RDW 13.5 08/04/2021   PLT 265.0 [id number]   Last metabolic panel Lab Results  Component Value Date   GLUCOSE 89 08/04/2021   NA 137 08/04/2021   K 4.4 08/04/2021   CL 101 08/04/2021   CO2 29 08/04/2021   BUN 12 08/04/2021   CREATININE 0.58 08/04/2021   CALCIUM 9.4 08/04/2021   PROT 7.0 08/04/2021   ALBUMIN 4.4 08/04/2021   BILITOT 0.4 08/04/2021   ALKPHOS 45 08/04/2021   AST 21 08/04/2021   ALT 19 08/04/2021   IMPRESSION AND PLAN:  Acute right lower quadrant abdominal pain.  Patient has had appendectomy and TAH/BSO in the remote past. Eval 2 days ago at urology showed no stone and normal urine (will get records).  Suspect acute diverticulitis. She has an allergy to ofloxacin listed on her med list but she does not recall anything about it. Penicillin is listed as an allergy as well but patient states that she has taken amoxicillin without problem in the past. Augmentin 875 twice daily x14 days.  Signs/symptoms to call or return for were reviewed and pt expressed understanding.  An After Visit Summary was printed and given to the patient.  FOLLOW UP: Return today (on 01/27/2022) for 6-7 d f/u abd pain.->  Earlier if needed.  Signed:  Crissie Sickles, MD           01/27/2022

## 2022-01-27 NOTE — Telephone Encounter (Signed)
She called again asking for Vevelyn Royals to give her a call. She reports that's in regards to the pharmacy [facility]) told her to call us back. Please call patient at [phone]

## 2022-01-27 NOTE — Telephone Encounter (Signed)
Please advise if medication can be sent. 

## 2022-01-27 NOTE — Telephone Encounter (Signed)
Ok, tramadol eRx'd

## 2022-01-27 NOTE — Telephone Encounter (Signed)
Pt advised message has sent to provider regarding medication request.

## 2022-01-27 NOTE — Telephone Encounter (Signed)
Pt called right after having an appointment today stating she does not have access to Tramadol like she believed she did. Please call her in some to [facility]

## 2022-01-30 NOTE — Telephone Encounter (Signed)
Pt picked up med on 10/20. Confirmed with pharmacy

## 2022-02-02 ENCOUNTER — Encounter: Payer: Self-pay | Admitting: Family Medicine

## 2022-02-02 ENCOUNTER — Ambulatory Visit (INDEPENDENT_AMBULATORY_CARE_PROVIDER_SITE_OTHER): Payer: BC Managed Care – PPO | Admitting: Family Medicine

## 2022-02-02 VITALS — BP 99/62 | HR 90 | Temp 98.2°F | Ht 64.5 in | Wt 130.8 lb

## 2022-02-02 DIAGNOSIS — K5792 Diverticulitis of intestine, part unspecified, without perforation or abscess without bleeding: Secondary | ICD-10-CM | POA: Diagnosis not present

## 2022-02-02 NOTE — Progress Notes (Signed)
OFFICE VISIT  02/02/2022  CC:  Chief Complaint  Patient presents with   Follow-up    Abdominal pain; still has minor pain but not as intense. Pain is not radiating but does still occur when she sits. She had to use a heating pad.     Patient is a 61 y.o. female who presents for 6d f/u abd pain. A/P as of last visit: "Acute right lower quadrant abdominal pain.  Patient has had appendectomy and TAH/BSO in the remote past. Eval 2 days ago at urology showed no stone and normal urine (will get records).  Suspect acute diverticulitis. She has an allergy to ofloxacin listed on her med list but she does not recall anything about it. Penicillin is listed as an allergy as well but patient states that she has taken amoxicillin without problem in the past. Augmentin 875 twice daily x14 days."  INTERIM HX: Pain is improving significantly, particularly the last 2 to 3 days. Appetite is still down but says she ate a normal meal at [employer] today. No problems from the Augmentin. Took Tylenol 500 mg a couple of times.  Has taken tramadol half to 1 tab but only occasionally because she does not like to take pills.  Past Medical History:  Diagnosis Date   Allergy    Alopecia    Arthritis    Endometriosis    History of kidney stones    Menopausal symptoms    Ovarian cyst     Past Surgical History:  Procedure Laterality Date   APPENDECTOMY     BILATERAL OOPHORECTOMY     ENDOMETRIAL ABLATION     LAPAROSCOPIC HYSTERECTOMY  1996   WISDOM TOOTH EXTRACTION      Outpatient Medications Prior to Visit  Medication Sig Dispense Refill   ALPRAZolam (XANAX) 0.25 MG tablet Take 0.125-0.25 mg by mouth daily as needed.     amoxicillin-clavulanate (AUGMENTIN) 875-125 MG tablet Take 1 tablet by mouth 2 (two) times daily. 20 tablet 0   cetirizine (ZYRTEC) 10 MG tablet Take 10 mg by mouth daily.     estradiol (ESTRACE) 2 MG tablet Take 2 mg by mouth daily.     guaiFENesin (MUCINEX) 600 MG 12 hr tablet  Take by mouth daily.     Multiple Vitamins-Minerals (AIRBORNE GUMMIES PO) Take by mouth.     Probiotic Product (PROBIOTIC DAILY PO) Take by mouth.     testosterone cypionate (DEPOTESTOSTERONE CYPIONATE) 200 MG/ML injection      traMADol (ULTRAM) 50 MG tablet 1-2 tabs tid prn pain 20 tablet 0   triamcinolone cream (KENALOG) 0.1 % Apply 1 application  topically in the morning and at bedtime. Rarely uses     Vitamin D, Cholecalciferol, 25 MCG (1000 UT) CAPS Take by mouth once a week.     No facility-administered medications prior to visit.    Allergies  Allergen Reactions   Codeine Other (See Comments)   Neomycin-Bacitracin Zn-Polymyx     Other reaction(s): Other (See Comments)   Nitrofurantoin Other (See Comments)   Ofloxacin Other (See Comments)   Penicillins Other (See Comments)   Propoxyphene Other (See Comments)    ROS As per HPI  PE:    02/02/2022    3:06 PM 01/27/2022    2:02 PM 11/24/2021   12:01 PM  Vitals with BMI  Height 5' 4.5" 5' 4.5" 5' 4.5"  Weight 130 lbs 13 oz 130 lbs 13 oz 132 lbs  BMI 22.11 22.11 22.32  Systolic 99 111 118  Diastolic 62 66 64  Pulse 90 85 73   Physical Exam Exam chaperoned by Deveron Furlong, CMA.  Gen: Alert, well appearing.  Patient is oriented to person, place, time, and situation. AFFECT: pleasant, lucid thought and speech. ABD: soft, minimal discomfort to palpation in RLQ. No distention. No guarding or rebound.  BS normal  LABS:  Lab Results  Component Value Date   TSH 1.81 08/04/2021   Lab Results  Component Value Date   WBC 6.0 08/04/2021   HGB 13.7 08/04/2021   HCT 40.8 08/04/2021   MCV 92.2 08/04/2021   PLT 265.0 08/04/2021   Lab Results  Component Value Date   CREATININE 0.58 08/04/2021   BUN 12 08/04/2021   NA 137 08/04/2021   K 4.4 08/04/2021   CL 101 08/04/2021   CO2 29 08/04/2021   Lab Results  Component Value Date   ALT 19 08/04/2021   AST 21 08/04/2021   ALKPHOS 45 08/04/2021   BILITOT 0.4  08/04/2021   IMPRESSION AND PLAN:  Acute diverticulitis, resolving appropriately on Augmentin. Continue Tylenol 500 to 1000 mg every 6 hour as needed. She has tramadol to add as needed.  Signs/symptoms to call or return for were reviewed and pt expressed understanding.   An After Visit Summary was printed and given to the patient.  FOLLOW UP: No follow-ups on file.  Signed:  Crissie Sickles, MD           02/02/2022

## 2022-02-17 ENCOUNTER — Other Ambulatory Visit (INDEPENDENT_AMBULATORY_CARE_PROVIDER_SITE_OTHER): Payer: BC Managed Care – PPO

## 2022-02-17 DIAGNOSIS — Z114 Encounter for screening for human immunodeficiency virus [HIV]: Secondary | ICD-10-CM | POA: Diagnosis not present

## 2022-02-17 DIAGNOSIS — Z1159 Encounter for screening for other viral diseases: Secondary | ICD-10-CM

## 2022-02-18 LAB — HIV ANTIBODY (ROUTINE TESTING W REFLEX): HIV 1&2 Ab, 4th Generation: NONREACTIVE

## 2022-02-18 LAB — HEPATITIS C ANTIBODY: Hepatitis C Ab: NONREACTIVE

## 2022-04-26 ENCOUNTER — Ambulatory Visit (HOSPITAL_BASED_OUTPATIENT_CLINIC_OR_DEPARTMENT_OTHER): Payer: BC Managed Care – PPO

## 2022-05-18 ENCOUNTER — Telehealth: Payer: Self-pay

## 2022-05-18 ENCOUNTER — Encounter: Payer: Self-pay | Admitting: Internal Medicine

## 2022-05-18 ENCOUNTER — Ambulatory Visit (INDEPENDENT_AMBULATORY_CARE_PROVIDER_SITE_OTHER): Payer: BC Managed Care – PPO | Admitting: Internal Medicine

## 2022-05-18 VITALS — BP 108/62 | HR 76 | Temp 97.7°F | Resp 16 | Ht 64.5 in | Wt 130.2 lb

## 2022-05-18 DIAGNOSIS — N951 Menopausal and female climacteric states: Secondary | ICD-10-CM | POA: Diagnosis not present

## 2022-05-18 DIAGNOSIS — Z0000 Encounter for general adult medical examination without abnormal findings: Secondary | ICD-10-CM

## 2022-05-18 LAB — LIPID PANEL
Cholesterol: 184 mg/dL (ref 0–200)
HDL: 67.8 mg/dL (ref >39.00)
LDL Cholesterol: 91 mg/dL (ref 0–99)
NonHDL: 115.89
Total CHOL/HDL Ratio: 3
Triglycerides: 126 mg/dL (ref 0.0–149.0)
VLDL: 25.2 mg/dL (ref 0.0–40.0)

## 2022-05-18 LAB — COMPREHENSIVE METABOLIC PANEL
ALT: 19 U/L (ref 0–35)
AST: 21 U/L (ref 0–37)
Albumin: 4.4 g/dL (ref 3.5–5.2)
Alkaline Phosphatase: 43 U/L (ref 39–117)
BUN: 15 mg/dL (ref 6–23)
CO2: 30 mEq/L (ref 19–32)
Calcium: 10.2 mg/dL (ref 8.4–10.5)
Chloride: 98 mEq/L (ref 96–112)
Creatinine, Ser: 0.63 mg/dL (ref 0.40–1.20)
GFR: 94.56 mL/min (ref >60.00)
Glucose, Bld: 91 mg/dL (ref 70–99)
Potassium: 4.8 mEq/L (ref 3.5–5.1)
Sodium: 138 mEq/L (ref 135–145)
Total Bilirubin: 0.5 mg/dL (ref 0.2–1.2)
Total Protein: 7 g/dL (ref 6.0–8.3)

## 2022-05-18 LAB — CBC WITH DIFFERENTIAL/PLATELET
Basophils Absolute: 0 10*3/uL (ref 0.0–0.1)
Basophils Relative: 0.5 % (ref 0.0–3.0)
Eosinophils Absolute: 0 10*3/uL (ref 0.0–0.7)
Eosinophils Relative: 0.7 % (ref 0.0–5.0)
HCT: 43.8 % (ref 36.0–46.0)
Hemoglobin: 15 g/dL (ref 12.0–15.0)
Lymphocytes Relative: 33.3 % (ref 12.0–46.0)
Lymphs Abs: 2 10*3/uL (ref 0.7–4.0)
MCHC: 34.3 g/dL (ref 30.0–36.0)
MCV: 91.4 fl (ref 78.0–100.0)
Monocytes Absolute: 0.4 10*3/uL (ref 0.1–1.0)
Monocytes Relative: 6.5 % (ref 3.0–12.0)
Neutro Abs: 3.6 10*3/uL (ref 1.4–7.7)
Neutrophils Relative %: 59 % (ref 43.0–77.0)
Platelets: 274 10*3/uL (ref 150.0–400.0)
RBC: 4.8 Mil/uL (ref 3.87–5.11)
RDW: 13.4 % (ref 11.5–15.5)
WBC: 6.2 10*3/uL (ref 4.0–10.5)

## 2022-05-18 LAB — VITAMIN D 25 HYDROXY (VIT D DEFICIENCY, FRACTURES): VITD: 38.53 ng/mL (ref 30.00–100.00)

## 2022-05-18 NOTE — Telephone Encounter (Signed)
Received fax confirmation

## 2022-05-18 NOTE — Telephone Encounter (Signed)
Physical form completed and faxed back to [facility] at [phone]. Form also emailed to Pt at [email or web address]. Form sent for scanning.

## 2022-05-18 NOTE — Assessment & Plan Note (Addendum)
Here for CPX Pulmonary nodule: Found on CT May 2023, saw pulmonary, had a PET scan and the nodule was 12 mm and not hypermetabolic.  They recommended a follow-up CT without contrast in June 2024. Tobacco: Intolerant to Chantix, bupropion did not help, has cut down to some extent, recommend  to continue her efforts to quit tobacco.  She understands. RTC 1 year

## 2022-05-18 NOTE — Patient Instructions (Signed)
C GO TO THE LAB : Get the blood work     [place], [place] Come back for   physical exam in 1 year    "[place] of attorney" ,  "Living will" (Advance care planning documents)  If you already have a living will or healthcare power of attorney, is recommended you bring the copy to be scanned in your chart.   The document will be available to all the doctors you see in the system.  Advance care planning is a process that supports adults in  understanding and sharing their preferences regarding future medical care.  The patient's preferences are recorded in documents called Advance Directives and the can be modified at any time while the patient is in full mental capacity.   If you don't have one, please consider create one.      More information at: [email or web address]

## 2022-05-18 NOTE — Assessment & Plan Note (Signed)
  Td 2016 S/p shingrix x 2 RSV 12/2021  Covid vax utd 12-2021  Had a flu shpot  Female care: to see gyn soon. Mammogram 07-07-21: Normal CCS: Cscope at age 62: no polyps per pt, no report   Cologuard : NEG 09/2020 Had diverticulitis few months ago, rec a Cscope when   is due for next CCS, sooner if has more GI symptoms Diet, exercise: Doing well Bones: Offered a bone density test, declined, on vitamin D. Healthcare POA: See AVS Labs: CMP FLP CBC vitamin D

## 2022-05-18 NOTE — Progress Notes (Addendum)
Subjective:    Patient ID: Kendra Shaw, female    DOB: April 25, 1960, 62 y.o.   MRN: [id number]  DOS:  05/18/2022 Type of visit - description: Here for CPX  Had an episode of diverticulitis October 2023, treated with Augmentin.  Symptoms resolved Had a URI during Christmas, still has a mild cough. No fever or chills.  Review of Systems  Other than above, a 14 point review of systems is negative    Past Medical History:  Diagnosis Date   Allergy    Alopecia    Arthritis    Endometriosis    History of kidney stones    Menopausal symptoms    Ovarian cyst     Past Surgical History:  Procedure Laterality Date   APPENDECTOMY     BILATERAL OOPHORECTOMY     ENDOMETRIAL ABLATION     LAPAROSCOPIC HYSTERECTOMY  1996   WISDOM TOOTH EXTRACTION     Social History   Socioeconomic History   Marital status: Soil scientist    Spouse name: Not on file   Number of children: 0   Years of education: Not on file   Highest education level: Some college, no degree  Occupational History   Occupation: part time- [employer]  Tobacco Use   Smoking status: Every Day    Packs/day: 0.75    Years: 60.00    Total pack years: 45.00    Types: Cigarettes   Smokeless tobacco: Never   Tobacco comments:    Currently  < 1/2 ppd , chantix tried x 3: could NOT tolerate ; bupropion: no help  Substance and Sexual Activity   Alcohol use: Yes    Comment: socially   Drug use: Not on file   Sexual activity: Not on file  Other Topics Concern   Not on file  Social History Narrative   Lives w/ significant other    Social Determinants of Health   Financial Resource Strain: Low Risk  (01/27/2022)   Overall Financial Resource Strain (CARDIA)    Difficulty of Paying Living Expenses: Not very hard  Food Insecurity: No Food Insecurity (01/27/2022)   Hunger Vital Sign    Worried About Running Out of Food in the Last Year: Never true    Ran Out of Food in the Last Year: Never true  Transportation Needs: No  Transportation Needs (01/27/2022)   PRAPARE - Hydrologist (Medical): No    Lack of Transportation (Non-Medical): No  Physical Activity: Insufficiently Active (01/27/2022)   Exercise Vital Sign    Days of Exercise per Week: 4 days    Minutes of Exercise per Session: 30 min  Stress: No Stress Concern Present (01/27/2022)   [place]    Feeling of Stress : Not at all  Social Connections: Moderately Integrated (01/27/2022)   Social Connection and Isolation Panel [NHANES]    Frequency of Communication with Friends and Family: Three times a week    Frequency of Social Gatherings with Friends and Family: More than three times a week    Attends Religious Services: 1 to 4 times per year    Active Member of [employer] or Organizations: No    Attends Music therapist: Not on file    Marital Status: Living with partner  Intimate Partner Violence: Not on file    Current Outpatient Medications  Medication Instructions   ALPRAZolam (XANAX) 0.125-0.25 mg, Daily PRN   cetirizine (ZYRTEC) 10 mg,  Oral, Daily   estradiol (ESTRACE) 2 mg, Oral, Daily   guaiFENesin (MUCINEX) 600 MG 12 hr tablet Oral, Daily   Multiple Vitamins-Minerals (AIRBORNE GUMMIES PO) Oral   Probiotic Product (PROBIOTIC DAILY PO) Oral   testosterone cypionate (DEPOTESTOSTERONE CYPIONATE) 200 MG/ML injection    triamcinolone cream (KENALOG) 0.1 % 1 application , Topical, 2 times daily, Rarely uses   Vitamin D, Cholecalciferol, 25 MCG (1000 UT) CAPS Oral, Weekly       Objective:   Physical Exam BP 108/62   Pulse 76   Temp 97.7 F (36.5 C) (Oral)   Resp 16   Ht 5' 4.5" (1.638 m)   Wt 130 lb 4 oz (59.1 kg)   SpO2 98%   BMI 22.01 kg/m  General: Well developed, NAD, BMI noted Neck: No  thyromegaly  HEENT:  Normocephalic . Face symmetric, atraumatic Lungs:  CTA B Normal respiratory effort, no intercostal retractions,  no accessory muscle use. Heart: RRR,  no murmur.  Abdomen:  Not distended, soft, non-tender. No rebound or rigidity.   Lower extremities: no pretibial edema bilaterally  Skin: Exposed areas without rash. Not pale. Not jaundice Neurologic:  alert & oriented X3.  Speech normal, gait appropriate for age and unassisted Strength symmetric and appropriate for age.  Psych: Cognition and judgment appear intact.  Cooperative with normal attention span and concentration.  Behavior appropriate. No anxious or depressed appearing.     Assessment     Assessment (new patient 07-2021, referred by Leeann Must). Decreased libido Endometriosis, hysterectomy, B oophorectomy Pulmonary nodule per CT 08/2021. +FH CAD Tobacco: Intolerant to Chantix, will be drink: No help  PLAN Here for CPX Pulmonary nodule: Found on CT May 2023, saw pulmonary, had a PET scan and the nodule was 12 mm and not hypermetabolic.  They recommended a follow-up CT without contrast in June 2024. Tobacco: Intolerant to Chantix, bupropion did not help, has cut down to some extent, recommend  to continue her efforts to quit tobacco.  She understands. RTC 1 year

## 2022-06-05 ENCOUNTER — Telehealth: Payer: BC Managed Care – PPO | Admitting: Physician Assistant

## 2022-06-05 DIAGNOSIS — B9689 Other specified bacterial agents as the cause of diseases classified elsewhere: Secondary | ICD-10-CM

## 2022-06-05 DIAGNOSIS — J019 Acute sinusitis, unspecified: Secondary | ICD-10-CM

## 2022-06-05 MED ORDER — AMOXICILLIN-POT CLAVULANATE 875-125 MG PO TABS: 1.0000 | ORAL_TABLET | Freq: Two times a day (BID) | ORAL | 0 refills | Status: DC

## 2022-06-05 NOTE — Progress Notes (Signed)

## 2022-06-12 ENCOUNTER — Encounter: Payer: Self-pay | Admitting: Internal Medicine

## 2022-06-12 ENCOUNTER — Ambulatory Visit: Payer: BC Managed Care – PPO | Admitting: Internal Medicine

## 2022-06-12 ENCOUNTER — Ambulatory Visit (HOSPITAL_BASED_OUTPATIENT_CLINIC_OR_DEPARTMENT_OTHER)
Admission: RE | Admit: 2022-06-12 | Discharge: 2022-06-12 | Disposition: A | Discharge status: Home / Self Care | Payer: BC Managed Care – PPO | Source: Ambulatory Visit | Attending: Internal Medicine | Admitting: Internal Medicine

## 2022-06-12 VITALS — BP 122/78 | HR 78 | Temp 98.0°F | Resp 18 | Ht 64.5 in | Wt 128.2 lb

## 2022-06-12 DIAGNOSIS — J069 Acute upper respiratory infection, unspecified: Secondary | ICD-10-CM | POA: Insufficient documentation

## 2022-06-12 DIAGNOSIS — J9801 Acute bronchospasm: Secondary | ICD-10-CM

## 2022-06-12 DIAGNOSIS — J40 Bronchitis, not specified as acute or chronic: Secondary | ICD-10-CM

## 2022-06-12 MED ORDER — PREDNISONE 10 MG PO TABS: ORAL_TABLET | ORAL | 0 refills | Status: DC

## 2022-06-12 MED ORDER — ALBUTEROL SULFATE HFA 108 (90 BASE) MCG/ACT IN AERS: 2.0000 | INHALATION_SPRAY | Freq: Four times a day (QID) | RESPIRATORY_TRACT | 1 refills | Status: DC | PRN

## 2022-06-12 MED ORDER — AZITHROMYCIN 250 MG PO TABS: ORAL_TABLET | ORAL | 0 refills | Status: DC

## 2022-06-12 NOTE — Assessment & Plan Note (Signed)
Bronchitis, bronchospasm, sinusitis: On and off cough and sinus congestion since Christmas, this episode started with about 2 or 3 weeks ago, was seen virtually few days ago, Rx amoxicillin without much help. On exam she is wheezing and coughing frequently. She has a history of pulmonary nodule, PFTs 11/24/2021 show mixed obstruction/restrictive findings, no clear-cut for emphysema, no emphysema changes on CT. Plan: Get a chest x-ray (history of pulmonary nodule, now with fever) Prednisone, Zithromax, albuterol, Mucinex DM, call if not gradually better.

## 2022-06-12 NOTE — Patient Instructions (Signed)
Go to the first floor and get a chest x-ray  Prednisone as prescribed for few days  Zithromax, an antibiotic  Albuterol 2 puffs every 6 hours until better  Mucinex DM over-the-counter  Call if not gradually better

## 2022-06-12 NOTE — Progress Notes (Signed)
   Subjective:    Patient ID: Kendra Shaw, female    DOB: 1960-08-02, 62 y.o.   MRN: [id number]  DOS:  06/12/2022 Type of visit - description: acute  Having on and off symptoms since Christmas, latest episode started about 2 to 3 weeks ago:  Persistent cough, worse at night, cannot sleep well, + whitish sputum, denies hemoptysis. Has chest pain "from coughing". Sinuses are congested and painful. For the last 5 nights, she has checked her temperature and is 1 degree over her normal.   Review of Systems See above   Past Medical History:  Diagnosis Date   Allergy    Alopecia    Arthritis    Endometriosis    History of kidney stones    Menopausal symptoms    Ovarian cyst     Past Surgical History:  Procedure Laterality Date   APPENDECTOMY     BILATERAL OOPHORECTOMY     ENDOMETRIAL ABLATION     LAPAROSCOPIC HYSTERECTOMY  1996   WISDOM TOOTH EXTRACTION      Current Outpatient Medications  Medication Instructions   ALPRAZolam (XANAX) 0.125-0.25 mg, Daily PRN   amoxicillin-clavulanate (AUGMENTIN) 875-125 MG tablet 1 tablet, Oral, 2 times daily   cetirizine (ZYRTEC) 10 mg, Oral, Daily   estradiol (ESTRACE) 2 mg, Oral, Daily   guaiFENesin (MUCINEX) 600 MG 12 hr tablet Oral, Daily   Multiple Vitamins-Minerals (AIRBORNE GUMMIES PO) Oral   Probiotic Product (PROBIOTIC DAILY PO) Oral   testosterone cypionate (DEPOTESTOSTERONE CYPIONATE) 200 MG/ML injection    triamcinolone cream (KENALOG) 0.1 % 1 application , Topical, 2 times daily, Rarely uses   Vitamin D, Cholecalciferol, 25 MCG (1000 UT) CAPS Oral, Weekly       Objective:   Physical Exam BP 122/78   Pulse 78   Temp 98 F (36.7 C) (Oral)   Resp 18   Ht 5' 4.5" (1.638 m)   Wt 128 lb 4 oz (58.2 kg)   SpO2 93%   BMI 21.67 kg/m  General:   Well developed, no distress but frequent cough noted. HEENT:  Normocephalic . Face symmetric, atraumatic. TMs normal.  Nose not congested, sinuses slightly TTP on the left.   Throat symmetric, normal rate. Lungs:  Rhonchi and wheezing bilaterally throughout.  Slightly decreased breath sounds on the left base?Marland Kitchen Normal respiratory effort, no intercostal retractions, no accessory muscle use. Heart: RRR,  no murmur.  Lower extremities: no pretibial edema bilaterally  Skin: Not pale. Not jaundice Neurologic:  alert & oriented X3.  Speech normal, gait appropriate for age and unassisted Psych--  Cognition and judgment appear intact.  Cooperative with normal attention span and concentration.  Behavior appropriate. No anxious or depressed appearing.      Assessment   Assessment (new patient 07-2021, referred by Carylon Perches). Decreased libido Endometriosis, hysterectomy, B oophorectomy Pulmonary nodule per CT 08/2021. +FH CAD Tobacco: Intolerant to Chantix, will be drink: No help  PLAN Bronchitis, bronchospasm, sinusitis: On and off cough and sinus congestion since Christmas, this episode started with about 2 or 3 weeks ago, was seen virtually few days ago, Rx amoxicillin without much help. On exam she is wheezing and coughing frequently. She has a history of pulmonary nodule, PFTs 11/24/2021 show mixed obstruction/restrictive findings, no clear-cut for emphysema, no emphysema changes on CT. Plan: Get a chest x-ray (history of pulmonary nodule, now with fever) Prednisone, Zithromax, albuterol, Mucinex DM, call if not gradually better.

## 2022-06-23 ENCOUNTER — Telehealth: Payer: Self-pay | Admitting: Internal Medicine

## 2022-06-23 ENCOUNTER — Telehealth: Payer: Self-pay

## 2022-06-23 MED ORDER — PREDNISONE 10 MG PO TABS: ORAL_TABLET | ORAL | 0 refills | Status: DC

## 2022-06-23 MED ORDER — ALPRAZOLAM 0.25 MG PO TABS: 0.1250 mg | ORAL_TABLET | Freq: Every day | ORAL | Status: AC | PRN

## 2022-06-23 MED ORDER — BUDESONIDE-FORMOTEROL FUMARATE 160-4.5 MCG/ACT IN AERO: 2.0000 | INHALATION_SPRAY | Freq: Two times a day (BID) | RESPIRATORY_TRACT | 3 refills | Status: DC

## 2022-06-23 NOTE — Telephone Encounter (Signed)
Patient was seen 06/12/2022, she had rhonchi and bronchospasm. Plan: Second round of prednisone, sent Start Symbicort 2 puffs twice daily.  Prescription sent. Office visit in 10 days If symptoms severe needs to be seen sooner Please let the patient know and arrange an appointment

## 2022-06-23 NOTE — Telephone Encounter (Signed)
Received call from Pt- she was informed by [facility] that generic Symbicort and Symbicort not covered by plan. Will attempt PA.   PA initiated via Covermymeds; KEY:  [id number]. Awaiting determination.

## 2022-06-23 NOTE — Telephone Encounter (Signed)
Spoke w/ Pt- informed of recommendations. Offered to schedule the 10 day f/u while I had her on the phone- she states she will call back next week to schedule.

## 2022-06-23 NOTE — Telephone Encounter (Signed)
Please advise- she was seen on 3/4 for cough that has been ongoing since Christmas. States that cough has come back.

## 2022-06-23 NOTE — Telephone Encounter (Signed)
Pt called stating that her symptoms from her last visit have returned after finishing the antibiotic and was wondering how to proceed.

## 2022-06-23 NOTE — Addendum Note (Signed)
Addended by: Kathlene November E on: 06/23/2022 12:39 PM   Modules accepted: Orders

## 2022-06-26 MED ORDER — FLUTICASONE-SALMETEROL 100-50 MCG/ACT IN AEPB: 1.0000 | INHALATION_SPRAY | Freq: Two times a day (BID) | RESPIRATORY_TRACT | 3 refills | Status: DC

## 2022-06-26 NOTE — Telephone Encounter (Signed)
We can try Advair twice daily , prescription ready to be sent

## 2022-06-26 NOTE — Telephone Encounter (Signed)
PA denied. Denial notification did not tell me what they do cover instead. Can you recommend anything?

## 2022-06-26 NOTE — Telephone Encounter (Signed)
Rx sent 

## 2022-07-06 ENCOUNTER — Ambulatory Visit: Payer: BC Managed Care – PPO | Admitting: Internal Medicine

## 2022-07-06 ENCOUNTER — Encounter: Payer: Self-pay | Admitting: Internal Medicine

## 2022-07-06 VITALS — BP 116/80 | HR 82 | Temp 97.8°F | Resp 16 | Ht 64.5 in | Wt 128.1 lb

## 2022-07-06 DIAGNOSIS — J40 Bronchitis, not specified as acute or chronic: Secondary | ICD-10-CM

## 2022-07-06 DIAGNOSIS — J9801 Acute bronchospasm: Secondary | ICD-10-CM

## 2022-07-06 NOTE — Patient Instructions (Signed)
Continue albuterol as needed for cough, wheezing, chest congestion  You could switch from Zyrtec to Allegra 60 mg twice a day as needed  Astepro (OTC) 2 sprays on each side of the nose twice a day during allergy season

## 2022-07-06 NOTE — Progress Notes (Signed)
   Subjective:    Patient ID: Kendra Shaw, female    DOB: 1960/11/19, 62 y.o.   MRN: [id number]  DOS:  07/06/2022 Type of visit - description: Follow-up  See LOV, Dx with bronchitis, bronchospasm.  Took  medications as rx, feeling much better. Currently with no fever or chills. Cough essentially resolved. Has developed sinus congestion, she thinks allergies.   Review of Systems See above   Past Medical History:  Diagnosis Date   Allergy    Alopecia    Arthritis    Endometriosis    History of kidney stones    Menopausal symptoms    Ovarian cyst     Past Surgical History:  Procedure Laterality Date   APPENDECTOMY     BILATERAL OOPHORECTOMY     ENDOMETRIAL ABLATION     LAPAROSCOPIC HYSTERECTOMY  1996   WISDOM TOOTH EXTRACTION      Current Outpatient Medications  Medication Instructions   albuterol (VENTOLIN HFA) 108 (90 Base) MCG/ACT inhaler 2 puffs, Inhalation, Every 6 hours PRN   ALPRAZolam (XANAX) 0.125-0.25 mg, Oral, Daily PRN   amoxicillin-clavulanate (AUGMENTIN) 875-125 MG tablet 1 tablet, Oral, 2 times daily   cetirizine (ZYRTEC) 10 mg, Oral, Daily   estradiol (ESTRACE) 2 mg, Oral, Daily   fluticasone-salmeterol (ADVAIR) 100-50 MCG/ACT AEPB 1 puff, Inhalation, 2 times daily   guaiFENesin (MUCINEX) 600 MG 12 hr tablet Oral, Daily   Multiple Vitamins-Minerals (AIRBORNE GUMMIES PO) Oral   predniSONE (DELTASONE) 10 MG tablet 3 tabs x 3 days, 2 tabs x 3 days, 1 tab x 3 days   Probiotic Product (PROBIOTIC DAILY PO) Oral   testosterone cypionate (DEPOTESTOSTERONE CYPIONATE) 200 MG/ML injection    triamcinolone cream (KENALOG) 0.1 % 1 application , Topical, 2 times daily, Rarely uses   Vitamin D, Cholecalciferol, 25 MCG (1000 UT) CAPS Oral, Weekly       Objective:   Physical Exam BP 116/80   Pulse 82   Temp 97.8 F (36.6 C) (Oral)   Resp 16   Ht 5' 4.5" (1.638 m)   Wt 128 lb 2 oz (58.1 kg)   SpO2 96%   BMI 21.65 kg/m  General:   Well developed, NAD,  BMI noted. HEENT:  Normocephalic . Face symmetric, atraumatic Lungs:  CTA B Normal respiratory effort, no intercostal retractions, no accessory muscle use. Heart: RRR,  no murmur.  Lower extremities: no pretibial edema bilaterally  Skin: Not pale. Not jaundice Neurologic:  alert & oriented X3.  Speech normal, gait appropriate for age and unassisted Psych--  Cognition and judgment appear intact.  Cooperative with normal attention span and concentration.  Behavior appropriate. No anxious or depressed appearing.      Assessment    Assessment (new patient 07-2021, referred by Carylon Perches). Decreased libido Endometriosis, hysterectomy, B oophorectomy Pulmonary nodule per CT 08/2021. +FH CAD Tobacco: Intolerant to Chantix, will be drink: No help  PLAN Bronchitis, bronchospasm, sinusitis See LOV, chest x-ray negative, took antibiotics, prednisone.  After the visit we tried to [employer], Advair, Trelegy.  Eventually she was able to get one of them but did not like how she felt. At this point she is better.  Has some sinus congestion, typical for allergy seasons Plan: Albuterol as needed, for allergies okay to switch from Zyrtec to Allegra to see if that works better, has been intolerant to [facility], makes her "'feel weird".  Recommend to take Astepro.

## 2022-07-07 NOTE — Assessment & Plan Note (Signed)
Bronchitis, bronchospasm, sinusitis See LOV, chest x-ray negative, took antibiotics, prednisone.  After the visit we tried to [employer], Advair, Trelegy.  Eventually she was able to get one of them but did not like how she felt. At this point she is better.  Has some sinus congestion, typical for allergy seasons Plan: Albuterol as needed, for allergies okay to switch from Zyrtec to Allegra to see if that works better, has been intolerant to [facility], makes her "'feel weird".  Recommend to take Astepro.

## 2022-07-13 ENCOUNTER — Encounter: Payer: Self-pay | Admitting: Internal Medicine

## 2022-07-13 LAB — HM MAMMOGRAPHY

## 2022-09-24 ENCOUNTER — Encounter: Payer: BC Managed Care – PPO | Admitting: Nurse Practitioner

## 2022-09-24 ENCOUNTER — Telehealth: Payer: BC Managed Care – PPO | Admitting: Family Medicine

## 2022-09-24 ENCOUNTER — Telehealth: Payer: BC Managed Care – PPO | Admitting: Nurse Practitioner

## 2022-09-24 DIAGNOSIS — J029 Acute pharyngitis, unspecified: Secondary | ICD-10-CM

## 2022-09-24 NOTE — Progress Notes (Signed)
DUPLICATE EVISIT 

## 2022-09-24 NOTE — Progress Notes (Signed)
E-Visit for Sore Throat  We are sorry that you are not feeling well.  Here is how we plan to help!  Your symptoms indicate a likely viral infection (Pharyngitis).   Pharyngitis is inflammation in the back of the throat which can cause a sore throat, scratchiness and sometimes difficulty swallowing.   Pharyngitis is typically caused by a respiratory virus and will just run its course.  Please keep in mind that your symptoms could last up to 10 days.  For throat pain, we recommend over the counter oral pain relief medications such as acetaminophen or aspirin, or anti-inflammatory medications such as ibuprofen or naproxen sodium.  Topical treatments such as oral throat lozenges or sprays may be used as needed.  Avoid close contact with loved ones, especially the very young and elderly.  Remember to wash your hands thoroughly throughout the day as this is the number one way to prevent the spread of infection and wipe down door knobs and counters with disinfectant.  After careful review of your answers, I would not recommend an antibiotic for your condition.  Antibiotics should not be used to treat conditions that we suspect are caused by viruses like the virus that causes the common cold or flu. However, some people can have Strep with atypical symptoms. You may need formal testing in clinic or office to confirm if your symptoms continue or worsen.  Providers prescribe antibiotics to treat infections caused by bacteria. Antibiotics are very powerful in treating bacterial infections when they are used properly.  To maintain their effectiveness, they should be used only when necessary.  Overuse of antibiotics has resulted in the development of super bugs that are resistant to treatment!    Home Care: Only take medications as instructed by your medical team. Do not drink alcohol while taking these medications. A steam or ultrasonic humidifier can help congestion.  You can place a towel over your head and  breathe in the steam from hot water coming from a faucet. Avoid close contacts especially the very young and the elderly. Cover your mouth when you cough or sneeze. Always remember to wash your hands.  Get Help Right Away If: You develop worsening fever or throat pain. You develop a severe head ache or visual changes. Your symptoms persist after you have completed your treatment plan.  Make sure you Understand these instructions. Will watch your condition. Will get help right away if you are not doing well or get worse.   Thank you for choosing an e-visit.  Your e-visit answers were reviewed by a board certified advanced clinical practitioner to complete your personal care plan. Depending upon the condition, your plan could have included both over the counter or prescription medications.  Please review your pharmacy choice. Make sure the pharmacy is open so you can pick up prescription now. If there is a problem, you may contact your provider through MyChart messaging and have the prescription routed to another pharmacy.  Your safety is important to us. If you have drug allergies check your prescription carefully.   For the next 24 hours you can use MyChart to ask questions about today's visit, request a non-urgent call back, or ask for a work or school excuse. You will get an email in the next two days asking about your experience. I hope that your e-visit has been valuable and will speed your recovery.  I have spent 5 minutes in review of e-visit questionnaire, review and updating patient chart, medical decision making and response   to patient.   Ellasyn Swilling, PA-C    

## 2022-09-24 NOTE — Progress Notes (Signed)
E-Visit for Sore Throat  We are sorry that you are not feeling well.  Here is how we plan to help!  Your symptoms indicate a likely viral infection (Pharyngitis).   Pharyngitis is inflammation in the back of the throat which can cause a sore throat, scratchiness and sometimes difficulty swallowing.   Pharyngitis is typically caused by a respiratory virus and will just run its course.  Please keep in mind that your symptoms could last up to 10 days.  For throat pain, we recommend over the counter oral pain relief medications such as acetaminophen or aspirin, or anti-inflammatory medications such as ibuprofen or naproxen sodium.  Topical treatments such as oral throat lozenges or sprays may be used as needed.  Avoid close contact with loved ones, especially the very young and elderly.  Remember to wash your hands thoroughly throughout the day as this is the number one way to prevent the spread of infection and wipe down door knobs and counters with disinfectant.  After careful review of your answers, I would not recommend an antibiotic for your condition.  Antibiotics should not be used to treat conditions that we suspect are caused by viruses like the virus that causes the common cold or flu. However, some people can have Strep with atypical symptoms. You may need formal testing in clinic or office to confirm if your symptoms continue or worsen.  Providers prescribe antibiotics to treat infections caused by bacteria. Antibiotics are very powerful in treating bacterial infections when they are used properly.  To maintain their effectiveness, they should be used only when necessary.  Overuse of antibiotics has resulted in the development of super bugs that are resistant to treatment!    Home Care: Only take medications as instructed by your medical team. Do not drink alcohol while taking these medications. A steam or ultrasonic humidifier can help congestion.  You can place a towel over your head and  breathe in the steam from hot water coming from a faucet. Avoid close contacts especially the very young and the elderly. Cover your mouth when you cough or sneeze. Always remember to wash your hands.  Get Help Right Away If: You develop worsening fever or throat pain. You develop a severe head ache or visual changes. Your symptoms persist after you have completed your treatment plan.  Make sure you Understand these instructions. Will watch your condition. Will get help right away if you are not doing well or get worse.   Thank you for choosing an e-visit.  Your e-visit answers were reviewed by a board certified advanced clinical practitioner to complete your personal care plan. Depending upon the condition, your plan could have included both over the counter or prescription medications.  Please review your pharmacy choice. Make sure the pharmacy is open so you can pick up prescription now. If there is a problem, you may contact your provider through MyChart messaging and have the prescription routed to another pharmacy.  Your safety is important to us. If you have drug allergies check your prescription carefully.   For the next 24 hours you can use MyChart to ask questions about today's visit, request a non-urgent call back, or ask for a work or school excuse. You will get an email in the next two days asking about your experience. I hope that your e-visit has been valuable and will speed your recovery.  I have spent 5 minutes in review of e-visit questionnaire, review and updating patient chart, medical decision making and response   to patient.   Randy Castrejon, PA-C    

## 2022-09-27 ENCOUNTER — Encounter: Payer: Self-pay | Admitting: Internal Medicine

## 2022-09-27 ENCOUNTER — Ambulatory Visit: Payer: BC Managed Care – PPO | Admitting: Internal Medicine

## 2022-09-27 VITALS — BP 108/60 | HR 88 | Temp 98.2°F | Resp 16 | Ht 64.5 in | Wt 130.2 lb

## 2022-09-27 DIAGNOSIS — J029 Acute pharyngitis, unspecified: Secondary | ICD-10-CM

## 2022-09-27 DIAGNOSIS — R21 Rash and other nonspecific skin eruption: Secondary | ICD-10-CM | POA: Diagnosis not present

## 2022-09-27 DIAGNOSIS — B349 Viral infection, unspecified: Secondary | ICD-10-CM | POA: Diagnosis not present

## 2022-09-27 LAB — POCT RAPID STREP A (OFFICE): Rapid Strep A Screen: NEGATIVE

## 2022-09-27 MED ORDER — KETOCONAZOLE 2 % EX CREA: 1.0000 | TOPICAL_CREAM | Freq: Every day | CUTANEOUS | 0 refills | Status: DC

## 2022-09-27 MED ORDER — BETAMETHASONE DIPROPIONATE AUG 0.05 % EX CREA: TOPICAL_CREAM | Freq: Two times a day (BID) | CUTANEOUS | 0 refills | Status: DC

## 2022-09-27 MED ORDER — AZITHROMYCIN 250 MG PO TABS: ORAL_TABLET | ORAL | 0 refills | Status: DC

## 2022-09-27 NOTE — Assessment & Plan Note (Signed)
Pharyngitis: Symptoms C/W pharyngitis, had tender and swollen  left-sided lymph node.  Strep test negative, will get strep culture and start empiric antibiotics. Of note this, had several visits for upper respiratory symptoms 06/05/2022, 06/12/2022, 07/06/2022, 09/24/2022 and today.  Recommend consistent use of Flonase, Astepro, consider allergist referral. Dermatitis: See physical exam, recommend Nizoral and Diprolene. Pulmonary nodule: Scheduled for CT in few days.

## 2022-09-27 NOTE — Progress Notes (Signed)
Subjective:    Patient ID: Kendra Shaw, female    DOB: 1960-08-30, 62 y.o.   MRN: [id number]  DOS:  09/27/2022 Type of visit - description: acute   Symptoms started a week ago: Sinus headache, runny nose, sneezing and sore throat. A day or 2 after the onset of symptoms the sore throat got intense and mostly located on the left side. 5 days ago had a negative COVID test (shortly after she figured out the test was expired). 4 days ago, the left submandibular side got swollen and TTP.  She thinks it was a gland that was irritated. Has taken naproxen, Cepacol etc. Yesterday symptoms started to subside, swelling at the left side  is essentially gone and sore throat is significantly diminished. No fever or chills, did have some night sweats last night.  Also had diarrhea  3 days ago, since then skin around the area is irritated.  No blood in the stools.  No nausea.  Denies any GERD symptoms, minimal cough in the last few days with no sputum production.  Review of Systems See above   Past Medical History:  Diagnosis Date   Allergy    Alopecia    Arthritis    Endometriosis    History of kidney stones    Menopausal symptoms    Ovarian cyst     Past Surgical History:  Procedure Laterality Date   APPENDECTOMY     BILATERAL OOPHORECTOMY     ENDOMETRIAL ABLATION     LAPAROSCOPIC HYSTERECTOMY  1996   WISDOM TOOTH EXTRACTION      Current Outpatient Medications  Medication Instructions   albuterol (VENTOLIN HFA) 108 (90 Base) MCG/ACT inhaler 2 puffs, Inhalation, Every 6 hours PRN   ALPRAZolam (XANAX) 0.125-0.25 mg, Oral, Daily PRN   cetirizine (ZYRTEC) 10 mg, Oral, Daily   estradiol (ESTRACE) 2 mg, Oral, Daily   guaiFENesin (MUCINEX) 600 MG 12 hr tablet Oral, Daily   Multiple Vitamins-Minerals (AIRBORNE GUMMIES PO) Oral   Probiotic Product (PROBIOTIC DAILY PO) Oral   testosterone cypionate (DEPOTESTOSTERONE CYPIONATE) 200 MG/ML injection    triamcinolone cream (KENALOG) 0.1  % 1 application , Topical, 2 times daily, Rarely uses   Vitamin D, Cholecalciferol, 25 MCG (1000 UT) CAPS Oral, Weekly       Objective:   Physical Exam BP 108/60   Pulse 88   Temp 98.2 F (36.8 C) (Oral)   Resp 16   Ht 5' 4.5" (1.638 m)   Wt 130 lb 4 oz (59.1 kg)   SpO2 97%   BMI 22.01 kg/m  General:   Well developed, NAD, BMI noted. HEENT:  Normocephalic . Face symmetric, atraumatic. Throat: Symmetric, no red, no white patches. Neck: Symmetric, has few l  submandibular lymph nodes, the left ones are slightly TTP.   Lungs:  Few rhonchi Normal respiratory effort, no intercostal retractions, no accessory muscle use. Heart: RRR,  no murmur.  Lower extremities: no pretibial edema bilaterally  Skin: Not pale. Not jaundice GU: Simple inspection of the area show a normal vagina, anus without hemorrhoids, she does have erythema in circular fashion on the left buttock near the anus. Neurologic:  alert & oriented X3.  Speech normal, gait appropriate for age and unassisted Psych--  Cognition and judgment appear intact.  Cooperative with normal attention span and concentration.  Behavior appropriate. No anxious or depressed appearing.      Assessment     Assessment (new patient 07-2021, referred by Leeann Must). Decreased libido Endometriosis, hysterectomy,  B oophorectomy Pulmonary nodule per CT 08/2021. +FH CAD Tobacco: Intolerant to Chantix, will be drink: No help  PLAN Pharyngitis: Symptoms C/W pharyngitis, had tender and swollen  left-sided lymph node.  Strep test negative, will get strep culture and start empiric antibiotics. Of note this, had several visits for upper respiratory symptoms 06/05/2022, 06/12/2022, 07/06/2022, 09/24/2022 and today.  Recommend consistent use of Flonase, Astepro, consider allergist referral. Dermatitis: See physical exam, recommend Nizoral and Diprolene. Pulmonary nodule: Scheduled for CT in few days.

## 2022-09-27 NOTE — Patient Instructions (Addendum)
Start Zithromax Drink plenty of fluids Tylenol as needed  For the skin irritation: I sent 2 creams, apply them twice daily for 1 week.  Call if not gradually better.  For long-term prevention of respiratory symptoms:   -Use over-the-counter Flonase: 2 nasal sprays on each side of the nose in the morning until you feel better  -Use OTC Astepro 2 nasal sprays on each side of the nose twice daily until better

## 2022-09-29 LAB — CULTURE, GROUP A STREP
MICRO NUMBER:: 15101949
SPECIMEN QUALITY:: ADEQUATE

## 2022-10-05 ENCOUNTER — Ambulatory Visit (HOSPITAL_BASED_OUTPATIENT_CLINIC_OR_DEPARTMENT_OTHER)
Admission: RE | Admit: 2022-10-05 | Discharge: 2022-10-05 | Disposition: A | Discharge status: Home / Self Care | Payer: BC Managed Care – PPO | Source: Ambulatory Visit | Attending: Primary Care | Admitting: Primary Care

## 2022-10-05 DIAGNOSIS — R911 Solitary pulmonary nodule: Secondary | ICD-10-CM | POA: Insufficient documentation

## 2022-10-23 NOTE — Progress Notes (Signed)
Please let patient know CT showed stable left lower lobe pulmonary nodule. No new nodule is seen.   Can you refer to lung cancer screening program if not already follow-up re: tobacco abuse. This is where she will get an annual CT chest for high risk patients with smoking history > 20 pack years

## 2022-10-25 ENCOUNTER — Telehealth: Payer: Self-pay | Admitting: Primary Care

## 2022-10-25 NOTE — Telephone Encounter (Signed)
Patient is returning phone call for CT scan results. Patient phone number is [phone]. Can leave detailed message on voicemail.

## 2022-11-01 NOTE — Telephone Encounter (Signed)
Pt calling in to speak with lisa, she wants to speak about going to [facility]

## 2022-11-01 NOTE — Telephone Encounter (Signed)
Called and spoke with patient, advised of results/recommendations per Ames Dura NP.  She stated that she had quite a lot of CT scans this year and last year and did not really see the need for additional CT scans given she was not having any problems and there had been no change in the nodule.  I explained that the [facility] program has one visit to explain the program and that the CT scan has a very low dose of radiation, lower than the typical CT scan.  She said she would think about it and discuss it with her partner and she would call us back and let us know what she decides.  Will await return call.

## 2022-11-01 NOTE — Telephone Encounter (Signed)
ATC x1 LVM for patient to call our office back regarding CT result's.

## 2022-11-01 NOTE — Telephone Encounter (Signed)
Pt returning missed call, pls advise

## 2022-11-01 NOTE — Telephone Encounter (Signed)
ATC patient.  LM for patient to call back when available.     Glenford Bayley, NP 10/23/2022 10:26 AM EDT     Please let patient know CT showed stable left lower lobe pulmonary nodule. No new nodule is seen.   Can you refer to lung cancer screening program if not already follow-up re: tobacco abuse. This is where she will get an annual CT chest for high risk patients with smoking history > 20 pack years

## 2022-12-22 IMAGING — CT CT CARDIAC CORONARY ARTERY CALCIUM SCORE
2 of 3 series · 13 of 20 positions shown, 15 images · non-contrast
Comparison: Chest radiographs 06/24/2014

Addendum:
CLINICAL DATA: Risk stratification: 60 Year-old White Female

EXAM:
Coronary Calcium Score
TECHNIQUE: The patient was scanned on a Siemens Force scanner. Axial
non-contrast 3 mm slices were carried out through the heart. The
data set was analyzed on a dedicated work station and scored using
the Agatson method.

[Series 2: cascseq 3.0 b35f 70% · axial · 0.31mm/px · z∈[-72,-15]mm · 5 of 39 slices shown]
[im 5/39  vessel]
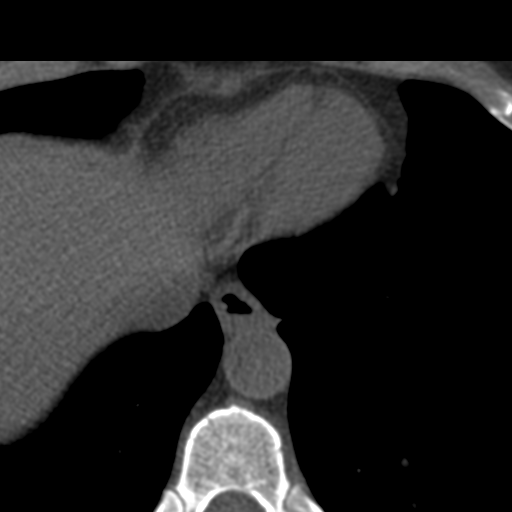
[im 10/39  vessel]
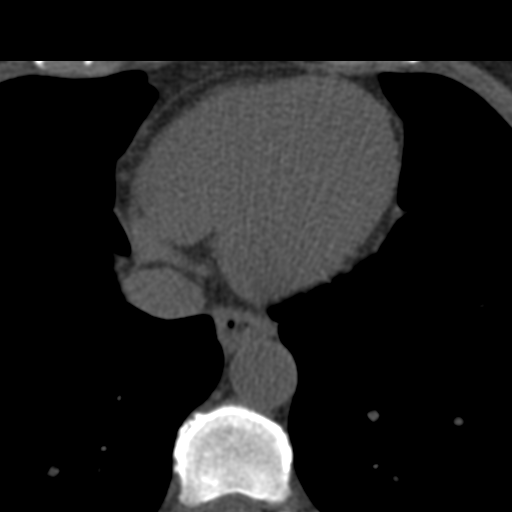
[im 15/39  vessel]
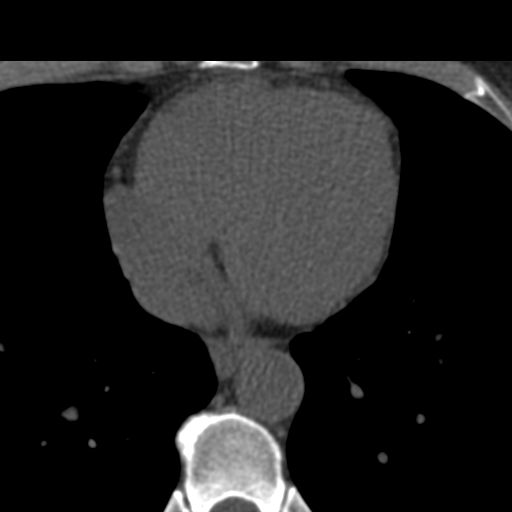
[im 20/39  vessel]
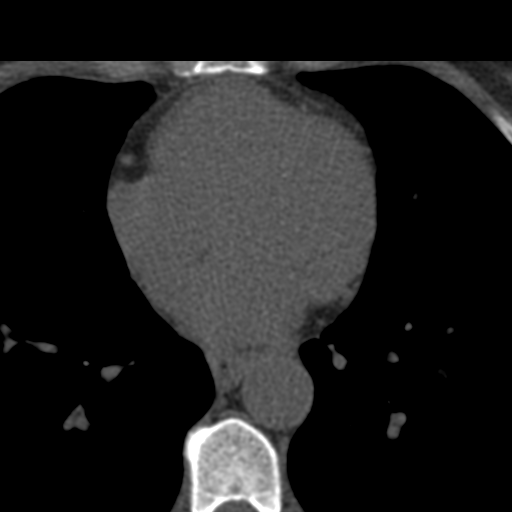
[im 24/39  vessel]
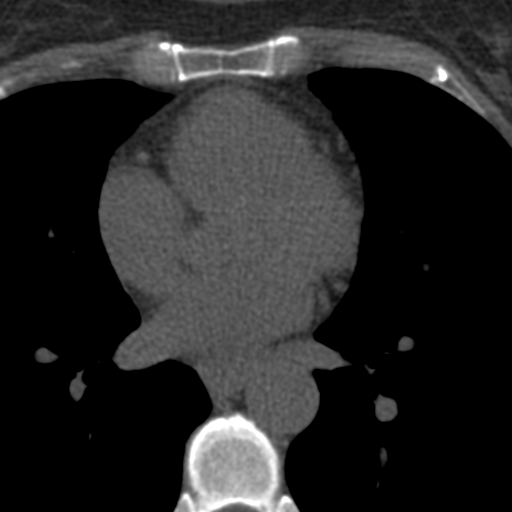

[Series 3: full fov st · axial · 0.54mm/px · z∈[-72,+15]mm · 8 of 39 slices shown, 10 images]
[im 5/39  vessel]
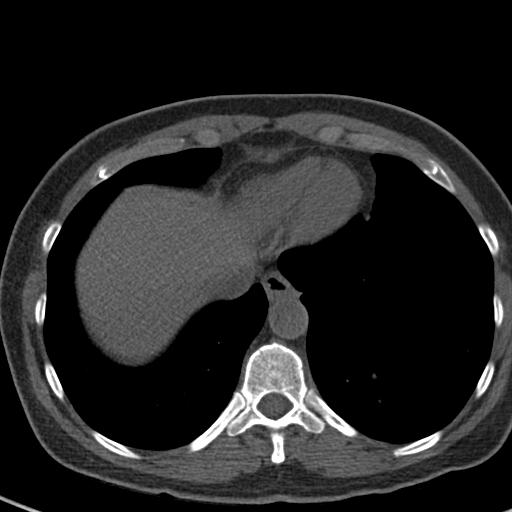
[im 5/39  lung]
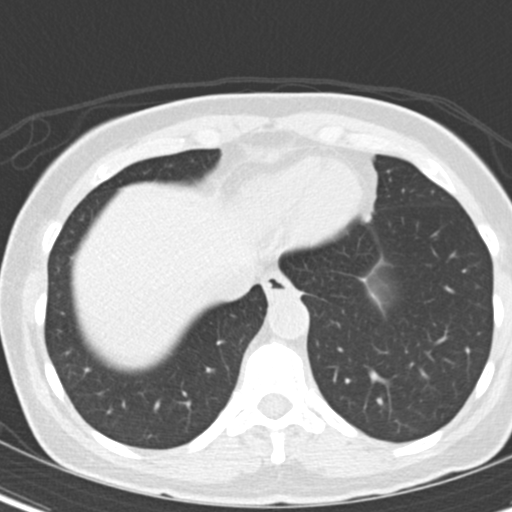
[im 9/39  vessel]
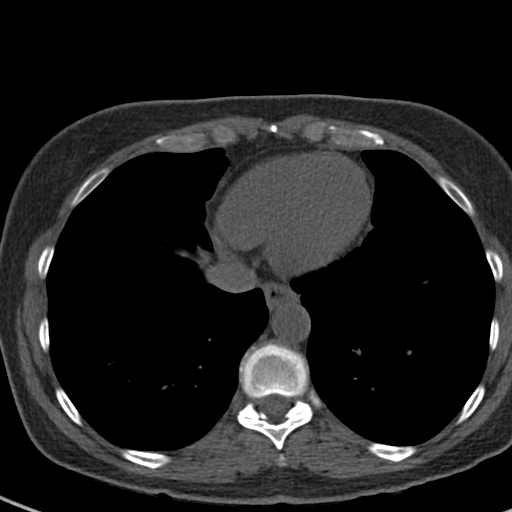
[im 13/39  vessel]
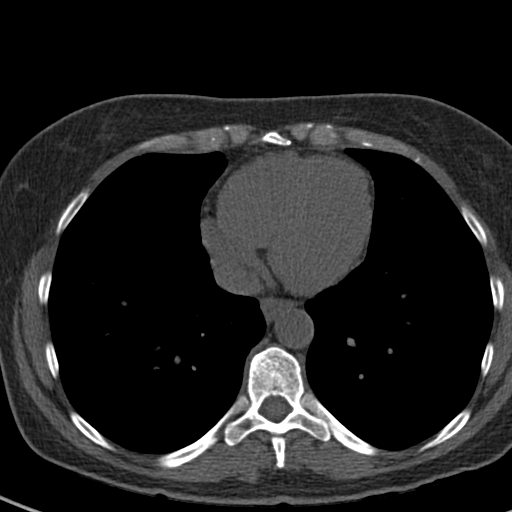
[im 17/39  vessel]
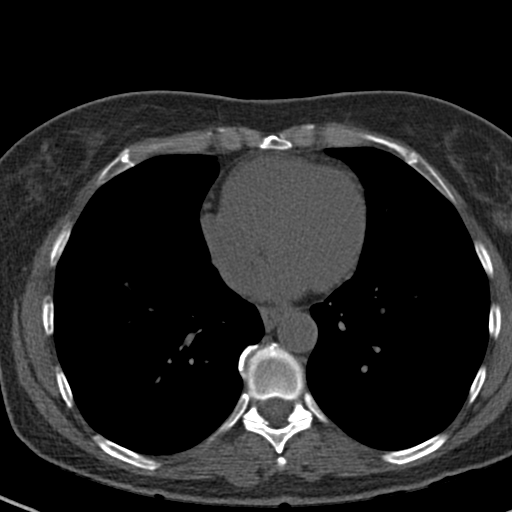
[im 22/39  vessel]
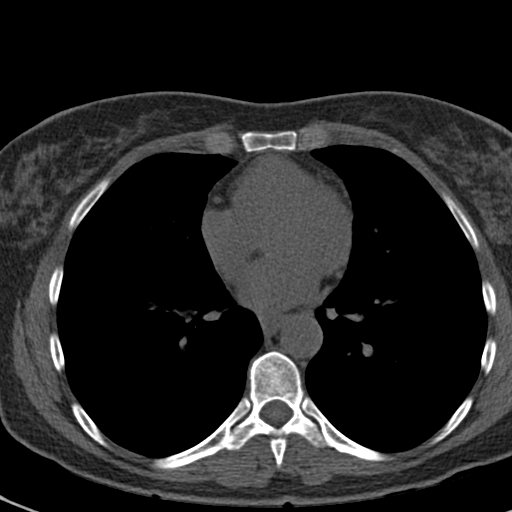
[im 22/39  lung]
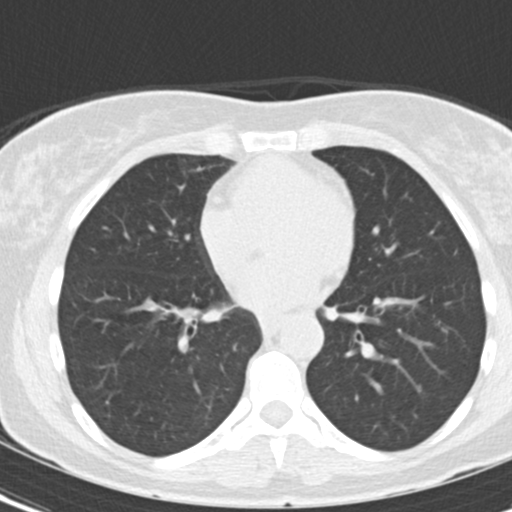
[im 26/39  vessel]
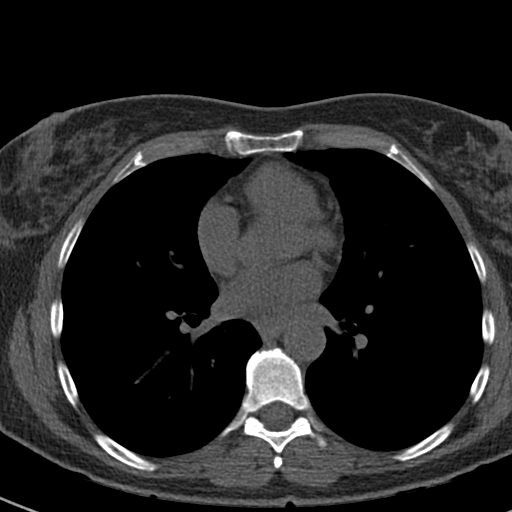
[im 30/39  vessel]
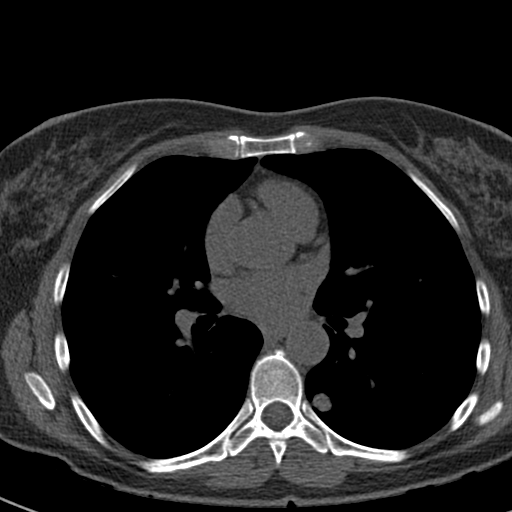
[im 34/39  vessel]
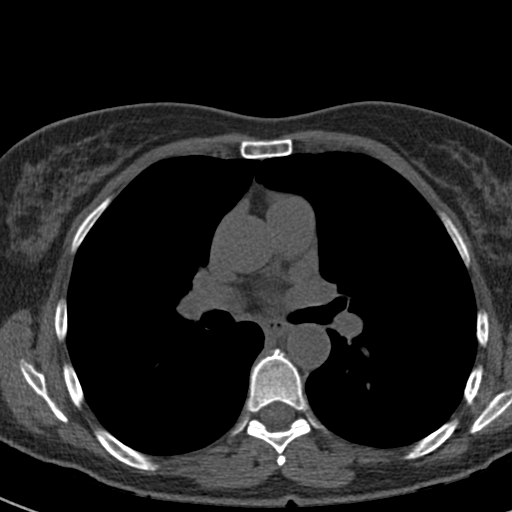

[13 of 20 positions shown; findings below may reference images not displayed]

FINDINGS: Non-cardiac: See separate report from [REDACTED].

Ascending Aorta: Normal caliber.

Pericardium: Normal.

Coronary arteries: Normal origins.

Coronary Calcium Score:

Left main: 0

Left anterior descending artery: 8

Left circumflex artery: 0

Right coronary artery: 0

Total: 8

Percentile: 66th for age, sex, and race matched control.
IMPRESSION: 1. Coronary calcium score of 8. This was 66th percentile for age,
gender, and race matched controls.

RECOMMENDATIONS:



If CAC = 0, it is reasonable to withhold statin therapy and reassess
in 5 to 10 years, as long as higher risk conditions are absent
(diabetes mellitus, family history of premature CHD in first degree
relatives (males <55 years; females <65 years), cigarette smoking,
LDL >=190 mg/dL or other independent risk factors).

If CAC is 1 to 99, it is reasonable to initiate statin therapy for
patients >=55 years of age.

If CAC is >=100 or >=75th percentile, it is reasonable to initiate
statin therapy at any age.

Cardiology referral should be considered for patients with CAC
scores =400 or >=75th percentile.

*1103 AHA/ACC/AACVPR/AAPA/ABC/LUAN/NOMASIBULELE/JUMPER/Dy/JUMPER/JUNMEI/QUIRIJN
Guideline on the Management of Blood Cholesterol: A Report of the
American College of Cardiology/American Heart Association Task Force
on Clinical Practice Guidelines. J Am Coll Cardiol.
1793;73(24):0799-0719.

EXAM:
OVER-READ INTERPRETATION  CT CHEST

The following report is a limited chest CT over-read performed by
08/25/2021. This over-read does not include interpretation of cardiac
or coronary anatomy or pathology. The coronary calcium score
interpretation by the cardiologist is attached.
FINDINGS: Cardiovascular: No significant extracardiac vascular findings.

Mediastinum/Nodes: No enlarged lymph nodes within the visualized
mediastinum.Small hiatal hernia.

Lungs/Pleura: No pleural effusion or pneumothorax. There is a mildly
lobulated, noncalcified nodule medially in the left lower lobe,
measuring 1.3 x 1.1 cm on image [DATE]. The visualized lungs are
otherwise clear, without suspicious nodularity.

Upper abdomen: No significant findings in the visualized upper
abdomen.

Musculoskeletal/Chest wall: Possible nondisplaced rib fracture
laterally in the right versus motion artifact (sagittal image
19/607). No other osseous abnormalities are seen.
IMPRESSION: 1. 12 mm left solid pulmonary nodule detected on incomplete chest
CT, potentially neoplastic. Recommend prompt non-contrast Chest CT
for further evaluation.
These guidelines do not apply to immunocompromised patients and
patients with cancer. Follow up in patients with significant
comorbidities as clinically warranted. For lung cancer screening,
adhere to Lung-RADS guidelines. Reference: Radiology. 3156;
284(1):228-43.
2. No other significant extracardiac findings within the visualized
chest.
3. These results will be called to the ordering clinician or
representative by the Radiologist Assistant, and communication
documented in the PACS or [REDACTED].

*** End of Addendum ***
FINDINGS: Non-cardiac: See separate report from [REDACTED].

Ascending Aorta: Normal caliber.

Pericardium: Normal.

Coronary arteries: Normal origins.

Coronary Calcium Score:

Left main: 0

Left anterior descending artery: 8

Left circumflex artery: 0

Right coronary artery: 0

Total: 8

Percentile: 66th for age, sex, and race matched control.
IMPRESSION: 1. Coronary calcium score of 8. This was 66th percentile for age,
gender, and race matched controls.

RECOMMENDATIONS:



If CAC = 0, it is reasonable to withhold statin therapy and reassess
in 5 to 10 years, as long as higher risk conditions are absent
(diabetes mellitus, family history of premature CHD in first degree
relatives (males <55 years; females <65 years), cigarette smoking,
LDL >=190 mg/dL or other independent risk factors).

If CAC is 1 to 99, it is reasonable to initiate statin therapy for
patients >=55 years of age.

If CAC is >=100 or >=75th percentile, it is reasonable to initiate
statin therapy at any age.

Cardiology referral should be considered for patients with CAC
scores =400 or >=75th percentile.

*1103 AHA/ACC/AACVPR/AAPA/ABC/LUAN/NOMASIBULELE/JUMPER/Dy/JUMPER/JUNMEI/QUIRIJN
Guideline on the Management of Blood Cholesterol: A Report of the
American College of Cardiology/American Heart Association Task Force
on Clinical Practice Guidelines. J Am Coll Cardiol.
1793;73(24):0799-0719.

## 2023-01-04 IMAGING — CT CT CHEST W/O CM
2 of 3 series · 15 of 36 positions shown, 18 images · non-contrast
Comparison: Cardiac CT 08/25/2021

CLINICAL DATA: Left-sided lung nodule.



[Series 2: thorax · axial · 0.70mm/px · z∈[-293,-31]mm · 12 of 155 slices shown, 15 images]
[im 12/155  mediastinal]
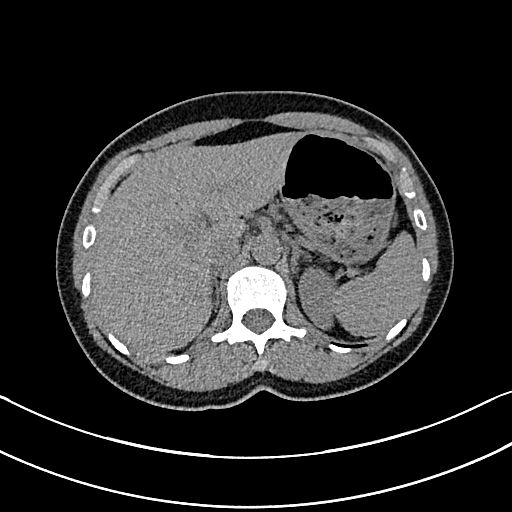
[im 12/155  lung]
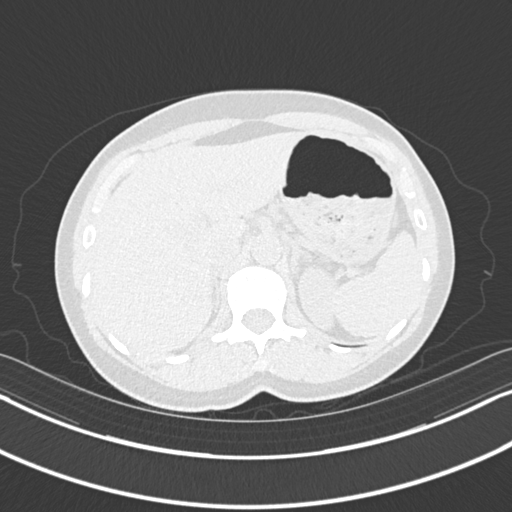
[im 23/155  lung]
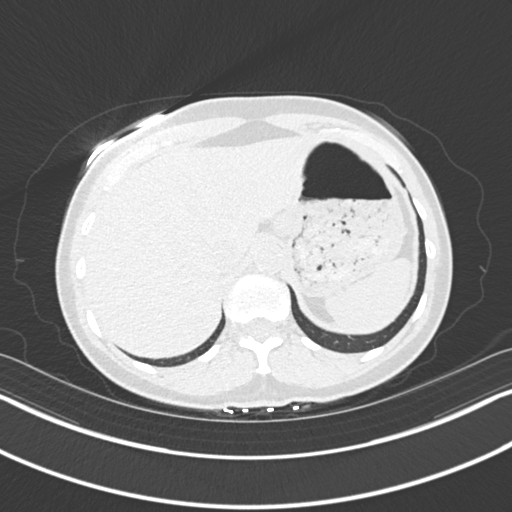
[im 35/155  lung]
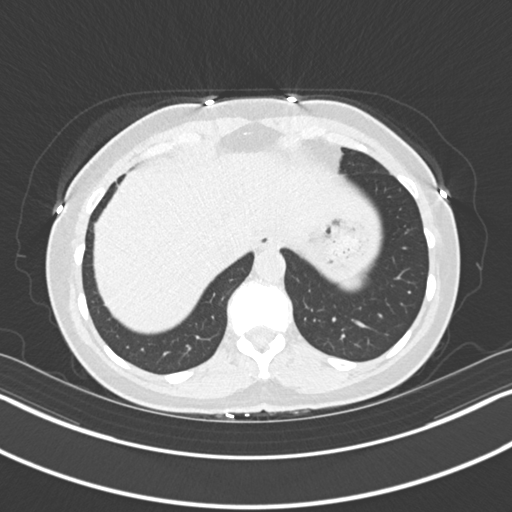
[im 46/155  lung]
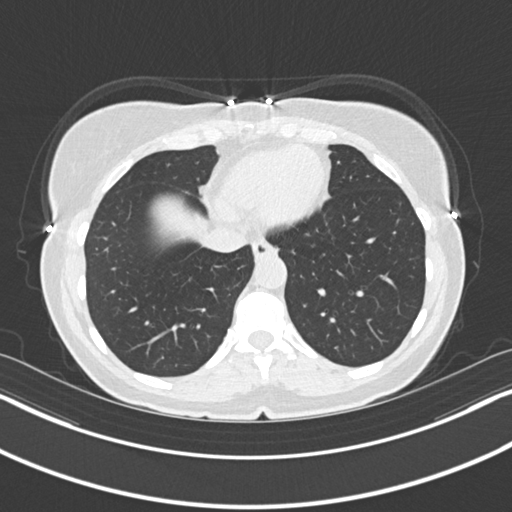
[im 58/155  mediastinal]
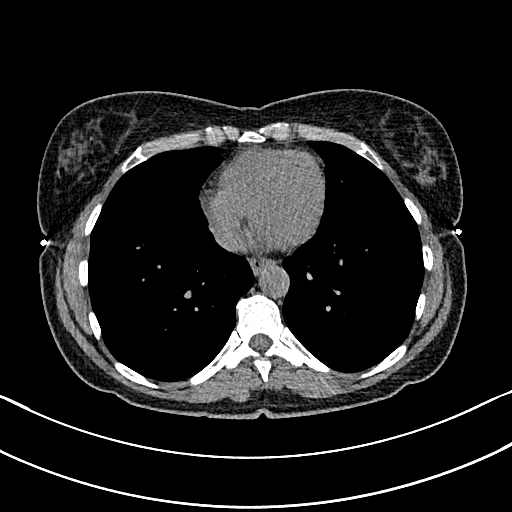
[im 58/155  lung]
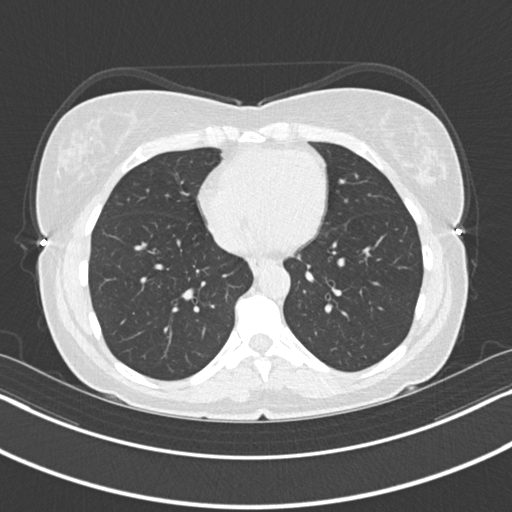
[im 69/155  lung]
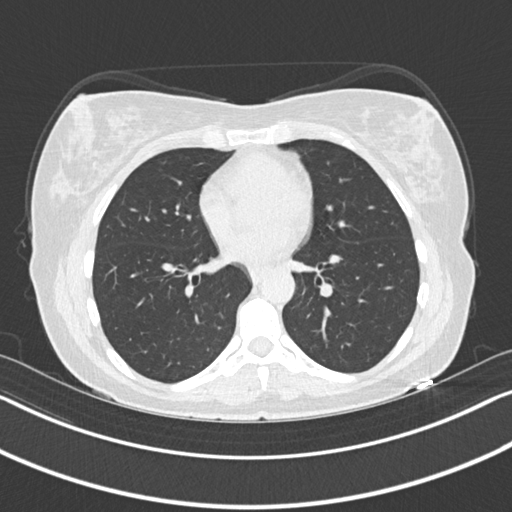
[im 86/155  lung]
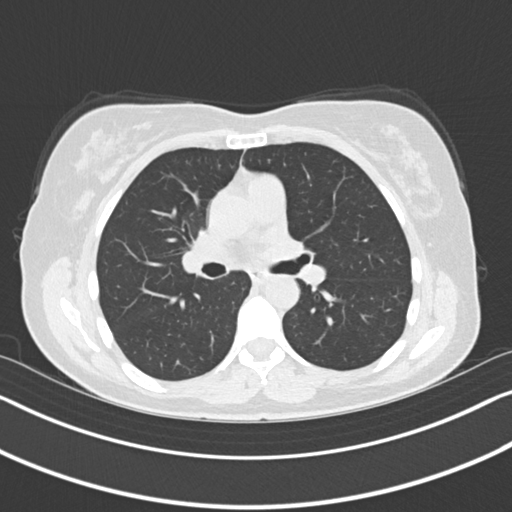
[im 97/155  lung]
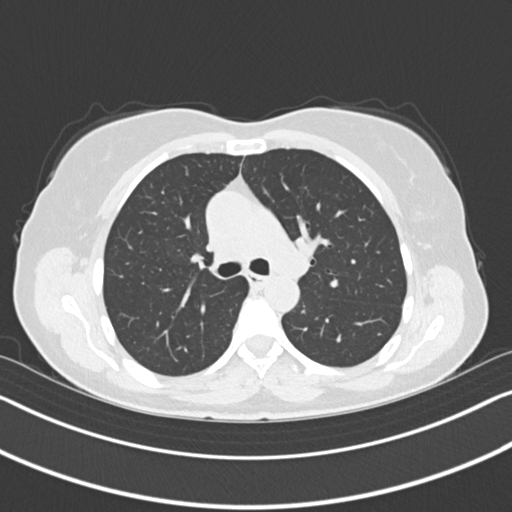
[im 109/155  mediastinal]
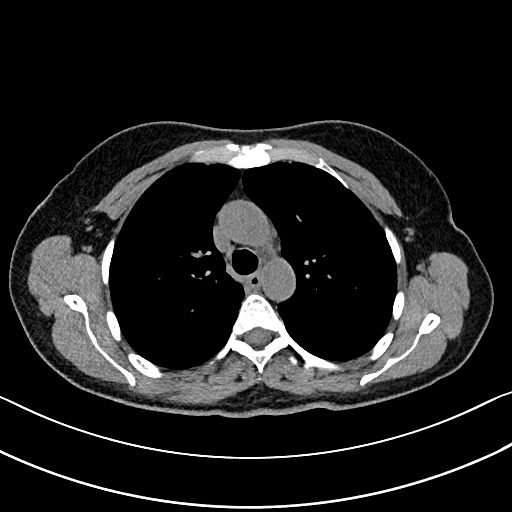
[im 109/155  lung]
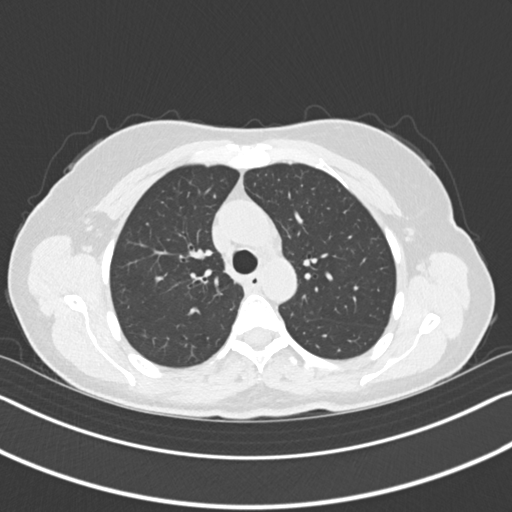
[im 120/155  lung]
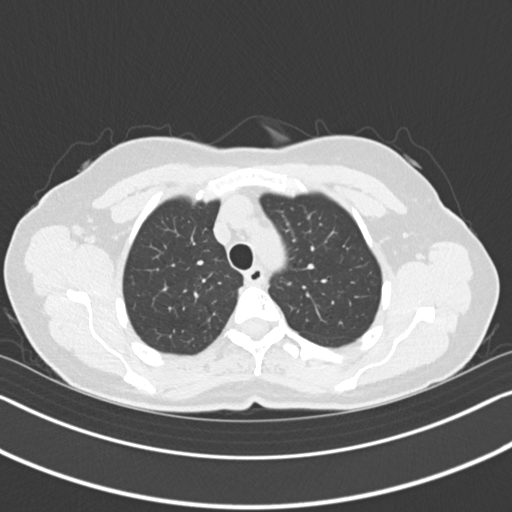
[im 132/155  lung]
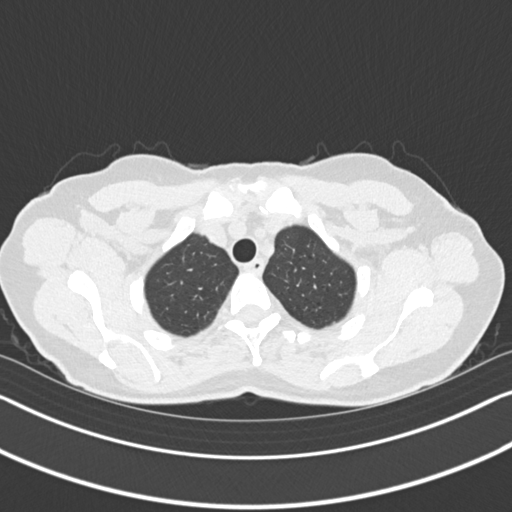
[im 143/155  lung]
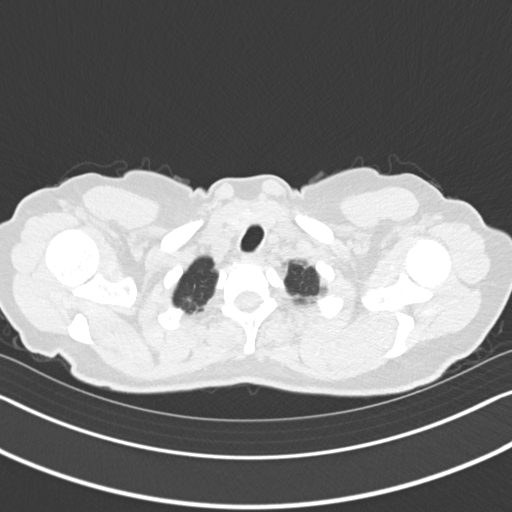

[Series 5: coronal · coronal · 0.60mm/px · 3 of 114 slices shown]
[im 23/114  lung]
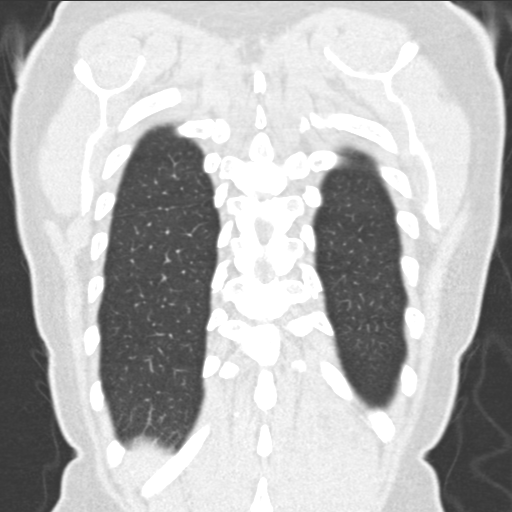
[im 46/114  lung]
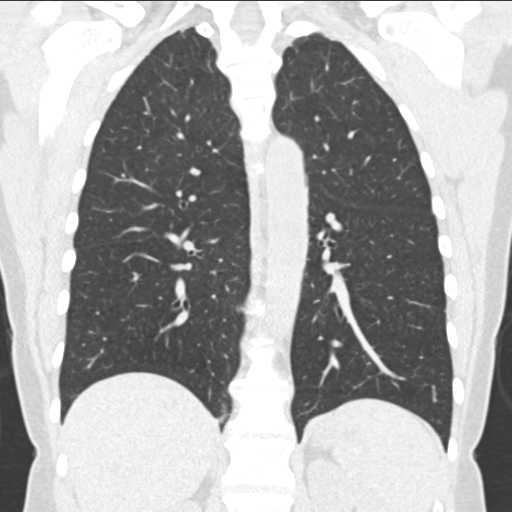
[im 68/114  lung]
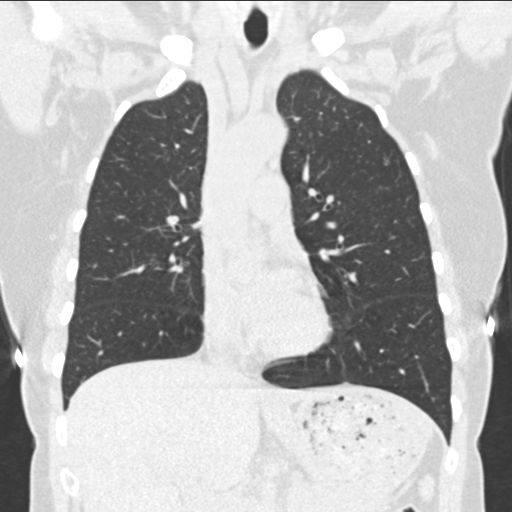

[15 of 36 positions shown; findings below may reference images not displayed]

FINDINGS: Cardiovascular: No significant vascular findings. Normal heart size.
No pericardial effusion.

Mediastinum/Nodes: No enlarged mediastinal or axillary lymph nodes.
Thyroid gland, trachea, and esophagus demonstrate no significant
findings.

Lungs/Pleura: There is a 1.2 x 1.0 cm left lower lobe solid
pulmonary nodule. The lungs are otherwise clear. No other pulmonary
nodules are seen. No pleural effusion or pneumothorax.

Upper Abdomen: No acute abnormality.

Musculoskeletal: No evidence for fracture. T4 vertebral body
hemangioma is present.
IMPRESSION: 1. Single 12 mm left lower lobe pulmonary nodule. Consider one of
the following in 3 months for both low-risk and high-risk
individuals: (a) repeat chest CT, (b) follow-up PET-CT, or (c)
tissue sampling. This recommendation follows the consensus
statement: Guidelines for Management of Incidental Pulmonary Nodules
Detected on CT Images: From the [HOSPITAL] 0897; Radiology

## 2023-02-09 ENCOUNTER — Telehealth: Payer: BC Managed Care – PPO | Admitting: Physician Assistant

## 2023-02-09 DIAGNOSIS — B9689 Other specified bacterial agents as the cause of diseases classified elsewhere: Secondary | ICD-10-CM | POA: Diagnosis not present

## 2023-02-09 DIAGNOSIS — J019 Acute sinusitis, unspecified: Secondary | ICD-10-CM | POA: Diagnosis not present

## 2023-02-09 MED ORDER — DOXYCYCLINE HYCLATE 100 MG PO TABS: 100.0000 mg | ORAL_TABLET | Freq: Two times a day (BID) | ORAL | 0 refills | Status: DC

## 2023-02-09 NOTE — Progress Notes (Signed)

## 2023-05-11 ENCOUNTER — Telehealth: Payer: BC Managed Care – PPO | Admitting: Physician Assistant

## 2023-05-11 DIAGNOSIS — J069 Acute upper respiratory infection, unspecified: Secondary | ICD-10-CM | POA: Diagnosis not present

## 2023-05-11 MED ORDER — AMOXICILLIN 875 MG PO TABS: 875.0000 mg | ORAL_TABLET | Freq: Two times a day (BID) | ORAL | 0 refills | Status: AC

## 2023-05-11 NOTE — Progress Notes (Signed)

## 2023-05-24 ENCOUNTER — Telehealth: Payer: Self-pay | Admitting: Internal Medicine

## 2023-05-24 NOTE — Telephone Encounter (Addendum)
Spoke w/ Pt- she started w/ a sinus infection a little over 1 month ago, she treated w/ otc meds but she finally did an e-visit  on 05/11/23- she was given amoxicillin 875mg  bid for 10 days and started the abx and finished the abx, the next day 05/12/23 she then tested positive for the flu. She did not seek treatment to get Tamiflu. Since then she is still running very low grade fevers (99.5-100), mainly in the afternoons however. She is still having cough to the point she vomited last night, mild wheezing and SHOB only when laying down at night- she has been using Mucinex and Robitussin for cough, and her albuterol inhaler as needed for wheezing, however she feels that the albuterol causes her to have a coughing fit. She does have her physical exam scheduled tomorrow morning at 0820, encouraged her to keep that appt. Informed I'd send a message to PCP to see if he had any recommendations until tomorrow morning. However instructed her to go to [facility] or ED if [facility] gets worse.

## 2023-05-24 NOTE — Telephone Encounter (Signed)
Keep appointment, ER if worse, depending on how she is feeling we may switch from CPX to an acute visit.

## 2023-05-24 NOTE — Telephone Encounter (Signed)
Spoke w/ Pt -informed of PCP recommendations. She verbalized understanding.

## 2023-05-24 NOTE — Telephone Encounter (Signed)
Patient is stating she does not feel well and has been coughing so much that she vomit she would like a call back regarding this

## 2023-05-25 ENCOUNTER — Ambulatory Visit: Payer: BC Managed Care – PPO | Admitting: Internal Medicine

## 2023-05-25 ENCOUNTER — Encounter: Payer: Self-pay | Admitting: Internal Medicine

## 2023-05-25 ENCOUNTER — Ambulatory Visit (HOSPITAL_BASED_OUTPATIENT_CLINIC_OR_DEPARTMENT_OTHER)
Admission: RE | Admit: 2023-05-25 | Discharge: 2023-05-25 | Disposition: A | Discharge status: Home / Self Care | Payer: BC Managed Care – PPO | Source: Ambulatory Visit | Attending: Internal Medicine | Admitting: Internal Medicine

## 2023-05-25 VITALS — BP 122/72 | HR 74 | Temp 98.2°F | Resp 16 | Ht 64.5 in | Wt 127.1 lb

## 2023-05-25 DIAGNOSIS — R059 Cough, unspecified: Secondary | ICD-10-CM | POA: Diagnosis present

## 2023-05-25 DIAGNOSIS — J189 Pneumonia, unspecified organism: Secondary | ICD-10-CM

## 2023-05-25 MED ORDER — ALBUTEROL SULFATE HFA 108 (90 BASE) MCG/ACT IN AERS: 2.0000 | INHALATION_SPRAY | Freq: Four times a day (QID) | RESPIRATORY_TRACT | 5 refills | Status: AC | PRN

## 2023-05-25 MED ORDER — AZITHROMYCIN 250 MG PO TABS: ORAL_TABLET | ORAL | 0 refills | Status: DC

## 2023-05-25 MED ORDER — BENZONATATE 200 MG PO CAPS: 200.0000 mg | ORAL_CAPSULE | Freq: Three times a day (TID) | ORAL | 0 refills | Status: DC | PRN

## 2023-05-25 NOTE — Assessment & Plan Note (Signed)
Suspect community-acquired pneumonia. Symptoms as described above, not feeling well for 4 weeks, running fever for the last 4 days. Plan:  Chest x-ray, Zithromax, OTC medications for cough, benzonatate, refill albuterol.  See detailed instructions in AVS.  Call if not better. Reschedule CPX for 3 weeks.

## 2023-05-25 NOTE — Patient Instructions (Addendum)
Get chest x-ray at the first floor  Antibiotics: Start Zithromax  For cough: Robitussin DM or Mucinex DM OTC. I sent a prescription for benzonatate, you can take it on top of Robtussin if you are still coughing.  Drink plenty of fluids  If you have some wheezing , use albuterol every 6 hours.  Call if not gradually better.  Seek medical attention if you have severe symptoms.  Please reschedule physical exam for around 3 weeks down the road.

## 2023-05-25 NOTE — Progress Notes (Signed)
Subjective:    Patient ID: Kendra Shaw, female    DOB: Oct 14, 1960, 63 y.o.   MRN: [id number]  DOS:  05/25/2023 Type of visit - description: Acute visit  Here for CPX but we switched to an acute OV due to cough.  Symptoms started 4 weeks ago, after the 10th day of sxs  she had a E-visit and was prescribed amoxicillin. The very next day, she developed full-blown viral syndrome symptoms: Hurting all over, fever; stay in bed for several days, missed work. By 05/21/2023, she felt better, went to work but again develop respiratory symptoms. Fever at night, last night T  was 99.3. Cough with sputum either likely or clear. Some chest pain with cough.  Mild shortness of breath.  Has used inhaler as needed.    Review of Systems See above   Past Medical History:  Diagnosis Date   Allergy    Alopecia    Arthritis    Endometriosis    History of kidney stones    Menopausal symptoms    Ovarian cyst     Past Surgical History:  Procedure Laterality Date   APPENDECTOMY     BILATERAL OOPHORECTOMY     ENDOMETRIAL ABLATION     LAPAROSCOPIC HYSTERECTOMY  1996   WISDOM TOOTH EXTRACTION      Current Outpatient Medications  Medication Instructions   albuterol (VENTOLIN HFA) 108 (90 Base) MCG/ACT inhaler 2 puffs, Inhalation, Every 6 hours PRN   ALPRAZolam (XANAX) 0.125-0.25 mg, Oral, Daily PRN   augmented betamethasone dipropionate (DIPROLENE-AF) 0.05 % cream Topical, 2 times daily   cetirizine (ZYRTEC) 10 mg, Daily   doxycycline (VIBRA-TABS) 100 mg, Oral, 2 times daily   estradiol (ESTRACE) 2 mg, Daily   guaiFENesin (MUCINEX) 600 MG 12 hr tablet Daily   ketoconazole (NIZORAL) 2 % cream 1 Application, Topical, Daily   Multiple Vitamins-Minerals (AIRBORNE GUMMIES PO) Take by mouth.   Probiotic Product (PROBIOTIC DAILY PO) Take by mouth.   testosterone cypionate (DEPOTESTOSTERONE CYPIONATE) 200 MG/ML injection    triamcinolone cream (KENALOG) 0.1 % 1 application , 2 times daily    Vitamin D, Cholecalciferol, 25 MCG (1000 UT) CAPS Weekly       Objective:   Physical Exam BP 122/72   Pulse 74   Temp 98.2 F (36.8 C) (Oral)   Resp 16   Ht 5' 4.5" (1.638 m)   Wt 127 lb 2 oz (57.7 kg)   SpO2 94%   BMI 21.48 kg/m  General:   Well developed, NAD, BMI noted.  Moderate cough noted HEENT:  Normocephalic . Face symmetric, atraumatic.  TMs normal.  Throat symmetric not red. Lungs:  Few rhonchi bilaterally, crackles at right base? Normal respiratory effort, no intercostal retractions, no accessory muscle use. Heart: RRR,  no murmur.  Lower extremities: no pretibial edema bilaterally  Skin: Not pale. Not jaundice Neurologic:  alert & oriented X3.  Speech normal, gait appropriate for age and unassisted Psych--  Cognition and judgment appear intact.  Cooperative with normal attention span and concentration.  Behavior appropriate. No anxious or depressed appearing.      Assessment     Assessment (new patient 07-2021, referred by Leeann Must). Decreased libido Endometriosis, hysterectomy, B oophorectomy Pulmonary nodule per CT 08/2021. +FH CAD Tobacco: Intolerant to Chantix, will be drink: No help  PLAN Suspect community-acquired pneumonia. Symptoms as described above, not feeling well for 4 weeks, running fever for the last 4 days. Plan:  Chest x-ray, Zithromax, OTC medications for cough,  benzonatate, refill albuterol.  See detailed instructions in AVS.  Call if not better. Reschedule CPX for 3 weeks.

## 2023-07-19 ENCOUNTER — Encounter: Payer: Self-pay | Admitting: Internal Medicine

## 2023-07-19 LAB — HM MAMMOGRAPHY

## 2023-07-20 ENCOUNTER — Encounter: Payer: Self-pay | Admitting: Internal Medicine

## 2023-07-24 ENCOUNTER — Encounter: Payer: Self-pay | Admitting: Internal Medicine

## 2023-08-17 ENCOUNTER — Ambulatory Visit (INDEPENDENT_AMBULATORY_CARE_PROVIDER_SITE_OTHER): Payer: BC Managed Care – PPO | Admitting: Internal Medicine

## 2023-08-17 ENCOUNTER — Telehealth: Payer: Self-pay

## 2023-08-17 ENCOUNTER — Encounter: Payer: Self-pay | Admitting: Internal Medicine

## 2023-08-17 VITALS — BP 112/60 | HR 66 | Temp 98.1°F | Resp 16 | Ht 64.5 in | Wt 124.1 lb

## 2023-08-17 DIAGNOSIS — Z122 Encounter for screening for malignant neoplasm of respiratory organs: Secondary | ICD-10-CM

## 2023-08-17 DIAGNOSIS — Z0000 Encounter for general adult medical examination without abnormal findings: Secondary | ICD-10-CM

## 2023-08-17 DIAGNOSIS — F17200 Nicotine dependence, unspecified, uncomplicated: Secondary | ICD-10-CM | POA: Diagnosis not present

## 2023-08-17 DIAGNOSIS — Z1211 Encounter for screening for malignant neoplasm of colon: Secondary | ICD-10-CM | POA: Diagnosis not present

## 2023-08-17 LAB — CBC WITH DIFFERENTIAL/PLATELET
Basophils Absolute: 0 10*3/uL (ref 0.0–0.1)
Basophils Relative: 0.6 % (ref 0.0–3.0)
Eosinophils Absolute: 0 10*3/uL (ref 0.0–0.7)
Eosinophils Relative: 0.4 % (ref 0.0–5.0)
HCT: 43.7 % (ref 36.0–46.0)
Hemoglobin: 14.7 g/dL (ref 12.0–15.0)
Lymphocytes Relative: 28.7 % (ref 12.0–46.0)
Lymphs Abs: 2.1 10*3/uL (ref 0.7–4.0)
MCHC: 33.7 g/dL (ref 30.0–36.0)
MCV: 92.2 fl (ref 78.0–100.0)
Monocytes Absolute: 0.5 10*3/uL (ref 0.1–1.0)
Monocytes Relative: 7.4 % (ref 3.0–12.0)
Neutro Abs: 4.5 10*3/uL (ref 1.4–7.7)
Neutrophils Relative %: 62.9 % (ref 43.0–77.0)
Platelets: 277 10*3/uL (ref 150.0–400.0)
RBC: 4.74 Mil/uL (ref 3.87–5.11)
RDW: 14.4 % (ref 11.5–15.5)
WBC: 7.2 10*3/uL (ref 4.0–10.5)

## 2023-08-17 LAB — LIPID PANEL
Cholesterol: 182 mg/dL (ref 0–200)
HDL: 73.9 mg/dL (ref >39.00)
LDL Cholesterol: 77 mg/dL (ref 0–99)
NonHDL: 108.36
Total CHOL/HDL Ratio: 2
Triglycerides: 159 mg/dL — ABNORMAL HIGH (ref 0.0–149.0)
VLDL: 31.8 mg/dL (ref 0.0–40.0)

## 2023-08-17 LAB — COMPREHENSIVE METABOLIC PANEL WITH GFR
ALT: 15 U/L (ref 0–35)
AST: 18 U/L (ref 0–37)
Albumin: 4.3 g/dL (ref 3.5–5.2)
Alkaline Phosphatase: 53 U/L (ref 39–117)
BUN: 12 mg/dL (ref 6–23)
CO2: 27 meq/L (ref 19–32)
Calcium: 9.2 mg/dL (ref 8.4–10.5)
Chloride: 102 meq/L (ref 96–112)
Creatinine, Ser: 0.58 mg/dL (ref 0.40–1.20)
GFR: 95.62 mL/min (ref >60.00)
Glucose, Bld: 91 mg/dL (ref 70–99)
Potassium: 3.9 meq/L (ref 3.5–5.1)
Sodium: 137 meq/L (ref 135–145)
Total Bilirubin: 0.6 mg/dL (ref 0.2–1.2)
Total Protein: 7 g/dL (ref 6.0–8.3)

## 2023-08-17 NOTE — Patient Instructions (Signed)
 INSTRUCTIONS  FOR TODAY  Vaccines I recommend: A flu shot every fall A COVID booster if not done since September 2024 Pneumonia shot at age 63.  You need a colon cancer screening again.  Will arrange for a CT of your chest for lung cancer screening   GO TO THE LAB : Get the blood work     Next office visit for a physical exam in 1 year Please make an appointment before you leave today   Steps to Quit Smoking Smoking tobacco is the leading cause of preventable death. It can affect almost every organ in the body. Smoking puts you and those around you at risk for developing many serious chronic diseases. Quitting smoking can be very challenging. Do not get discouraged if you are not successful the first time. Some people need to make many attempts to quit before they achieve long-term success. Do your best to stick to your quit plan, and talk with your health care provider if you have any questions or concerns. How do I get ready to quit? When you decide to quit smoking, create a plan to help you succeed. Before you quit: Pick a date to quit. Set a date within the next 2 weeks to give you time to prepare. Write down the reasons why you are quitting. Keep this list in places where you will see it often. Tell your family, friends, and co-workers that you are quitting. Support from people you are close to can make quitting easier. Talk with your health care provider about your options for quitting smoking. Find out what treatment options are covered by your health insurance. Identify people, places, things, and activities that make you want to smoke (triggers). Avoid them. What first steps can I take to quit smoking? Throw away all cigarettes at home, at work, and in your car. Throw away smoking accessories, such as Set designer. Clean your car. Make sure to empty the ashtray. Clean your home, including curtains and carpets. What strategies can I use to quit smoking? Talk with  your health care provider about combining strategies, such as taking medicines while you are also receiving in-person counseling. Using these two strategies together makes you more likely to succeed in quitting than if you used either strategy on its own. If you are pregnant or breastfeeding, talk with your health care provider about finding counseling or other support strategies to quit smoking. Do not take medicine to help you quit smoking unless your health care provider tells you to. Quit right away Quit smoking completely, instead of gradually reducing how much you smoke over a period of time. Stopping smoking right away may be more successful than gradually quitting. Attend in-person counseling to help you build problem-solving skills. You are more likely to succeed in quitting if you attend counseling sessions regularly. Even short sessions of 10 minutes can be effective. Take medicine You may take medicines to help you quit smoking. Some medicines require a prescription. You can also purchase over-the-counter medicines. Medicines may have nicotine in them to replace the nicotine in cigarettes. Medicines may: Help to stop cravings. Help to relieve withdrawal symptoms. Your health care provider may recommend: Nicotine patches, gum, or lozenges. Nicotine inhalers or sprays. Non-nicotine medicine that you take by mouth. Find resources Find resources and support systems that can help you quit smoking and remain smoke-free after you quit. These resources are most helpful when you use them often. They include: Online chats with a Veterinary surgeon. Telephone quitlines. Printed self-help  materials. Support groups or group counseling. Text messaging programs. Mobile phone apps or applications. Use apps that can help you stick to your quit plan by providing reminders, tips, and encouragement. Examples of free services include Quit Guide from the CDC and smokefree.gov  What can I do to make it easier to  quit?  Reach out to your family and friends for support and encouragement. Call telephone quitlines, such as 1-800-QUIT-NOW, reach out to support groups, or work with a counselor for support. Ask people who smoke to avoid smoking around you. Avoid places that trigger you to smoke, such as bars, parties, or smoke-break areas at work. Spend time with people who do not smoke. Lessen the stress in your life. Stress can be a smoking trigger for some people. To lessen stress, try: Exercising regularly. Doing deep-breathing exercises. Doing yoga. Meditating. What benefits will I see if I quit smoking? Over time, you should start to see positive results, such as: Improved sense of smell and taste. Decreased coughing and sore throat. Slower heart rate. Lower blood pressure. Clearer and healthier skin. The ability to breathe more easily. Fewer sick days. Summary Quitting smoking can be very challenging. Do not get discouraged if you are not successful the first time. Some people need to make many attempts to quit before they achieve long-term success. When you decide to quit smoking, create a plan to help you succeed. Quit smoking right away, not slowly over a period of time. Find resources and support systems that can help you quit smoking and remain smoke-free after you quit. This information is not intended to replace advice given to you by your health care provider. Make sure you discuss any questions you have with your health care provider. Document Revised: 03/18/2021 Document Reviewed: 03/18/2021 Elsevier Patient Education  2024 [employer].

## 2023-08-17 NOTE — Progress Notes (Unsigned)
 Subjective:    Patient ID: Kendra Shaw, female    DOB: 30-May-1960, 63 y.o.   MRN: [id number]  DOS:  08/17/2023 Type of visit - description: CPX  Here for CPX Chronic medical problems addressed. Reports a lot of stress, related to her significant other health Review of Systems  Other than above, a 14 point review of systems is negative   Past Medical History:  Diagnosis Date   Allergy    Alopecia    Arthritis    Endometriosis    History of kidney stones    Menopausal symptoms    Ovarian cyst     Past Surgical History:  Procedure Laterality Date   APPENDECTOMY     BILATERAL OOPHORECTOMY     ENDOMETRIAL ABLATION     LAPAROSCOPIC HYSTERECTOMY  1996   WISDOM TOOTH EXTRACTION     Social History   Social History Narrative   Lives w/ significant other       Current Outpatient Medications  Medication Instructions   albuterol  (VENTOLIN  HFA) 108 (90 Base) MCG/ACT inhaler 2 puffs, Inhalation, Every 6 hours PRN   ALPRAZolam  (XANAX ) 0.125-0.25 mg, Oral, Daily PRN   cetirizine (ZYRTEC) 10 mg, Daily   estradiol (ESTRACE) 2 mg, Daily   Multiple Vitamins-Minerals (AIRBORNE GUMMIES PO) Take by mouth.   Probiotic Product (PROBIOTIC DAILY PO) Take by mouth.   testosterone cypionate (DEPOTESTOSTERONE CYPIONATE) 200 MG/ML injection    Vitamin D , Cholecalciferol, 25 MCG (1000 UT) CAPS Weekly       Objective:   Physical Exam BP 112/60   Pulse 66   Temp 98.1 F (36.7 C) (Oral)   Resp 16   Ht 5' 4.5" (1.638 m)   Wt 124 lb 2 oz (56.3 kg)   SpO2 97%   BMI 20.98 kg/m  General: Well developed, NAD, BMI noted Neck: No  thyromegaly  HEENT:  Normocephalic . Face symmetric, atraumatic Lungs:  CTA B Normal respiratory effort, no intercostal retractions, no accessory muscle use. Heart: RRR,  no murmur.  Abdomen:  Not distended, soft, non-tender. No rebound or rigidity.   Lower extremities: no pretibial edema bilaterally  Skin: Exposed areas without rash. Not pale. Not  jaundice Neurologic:  alert & oriented X3.  Speech normal, gait appropriate for age and unassisted Strength symmetric and appropriate for age.  Psych: Cognition and judgment appear intact.  Cooperative with normal attention span and concentration.  Behavior appropriate. Slt anxious but not depressed appearing.     Assessment    Assessment (new patient 07-2021, referred by Arleta Bench). Decreased libido Endometriosis, hysterectomy, B oophorectomy Pulmonary nodule per CT 08/2021. +FH CAD Tobacco: Intolerant to Chantix, will be drink: No help  PLAN Here for CPX Td 2016 S/p shingrix x 2 RSV 12/2021  Vaccines are recommended: PNM 20 at age 3, flu shot every fall, COVID booster. Female care: to see gyn soon. Mammogram 07/2023 ([facility]) CCS: Cscope at age 52: no polyps per pt, no report   Cologuard : NEG 09/2020.    Had a episode of diverticulitis a cscope would be optima but elected for Cologuard, her husband is ill and will be very difficult for her to have a procedure. Diet, exercise: Counseled.  Bones: declined DEXAs before, on HRT and Vit  D. Healthcare POA: See AVS Labs: CMP FLP CBC Other issues addressed  Pulmonary nodule: CT 09/2022 stable.  Tobacco history: 1 PPD x 10 years, half PPD x 10 years, 1/3 PPD 25. Has > 20 pack. Plan: Proceed with  LDCT. Tobacco abuse: Counseled Anxiety: Listening therapy provided, significant other has cancer. RTC 1 year

## 2023-08-17 NOTE — Telephone Encounter (Signed)
 Physical form completed and mailed to Pt as requested. Copy of form sent for scanning.

## 2023-08-18 ENCOUNTER — Encounter: Payer: Self-pay | Admitting: Internal Medicine

## 2023-08-18 NOTE — Assessment & Plan Note (Signed)
 Here for CPX  Other issues addressed  Pulmonary nodule: CT 09/2022 stable.  Tobacco history: 1 PPD x 10 years, half PPD x 10 years, 1/3 PPD 25. Has > 20 pack. Plan: Proceed with LDCT. Tobacco abuse: Counseled Anxiety: Listening therapy provided, significant other has cancer. RTC 1 year

## 2023-08-18 NOTE — Assessment & Plan Note (Signed)
 Here for CPX Td 2016 S/p shingrix x 2 RSV 12/2021  Vaccines are recommended: PNM 20 at age 63, flu shot every fall, COVID booster. Female care: to see gyn soon. Mammogram 07/2023 ([facility]) CCS: Cscope at age 62: no polyps per pt, no report   Cologuard : NEG 09/2020.    Had a episode of diverticulitis a cscope would be optima but elected for Cologuard, her husband is ill and will be very difficult for her to have a procedure. Diet, exercise: Counseled.  Bones: declined DEXAs before, on HRT and Vit  D. Healthcare POA: See AVS Labs: CMP FLP CBC

## 2023-08-19 ENCOUNTER — Encounter: Payer: Self-pay | Admitting: Internal Medicine

## 2023-10-02 LAB — COLOGUARD: COLOGUARD: NEGATIVE

## 2023-10-03 ENCOUNTER — Ambulatory Visit: Payer: Self-pay | Admitting: Internal Medicine

## 2024-03-21 ENCOUNTER — Telehealth: Admitting: Family Medicine

## 2024-03-21 ENCOUNTER — Ambulatory Visit: Payer: Self-pay

## 2024-03-21 DIAGNOSIS — J019 Acute sinusitis, unspecified: Secondary | ICD-10-CM | POA: Diagnosis not present

## 2024-03-21 DIAGNOSIS — B9689 Other specified bacterial agents as the cause of diseases classified elsewhere: Secondary | ICD-10-CM | POA: Diagnosis not present

## 2024-03-21 MED ORDER — AMOXICILLIN 875 MG PO TABS: 875.0000 mg | ORAL_TABLET | Freq: Two times a day (BID) | ORAL | 0 refills | Status: AC

## 2024-03-21 NOTE — Telephone Encounter (Signed)
 Telehealth appt scheduled.

## 2024-03-21 NOTE — Progress Notes (Signed)
 Virtual Visit Consent   Kendra Shaw, you are scheduled for a virtual visit with a [facility] provider today. Just as with appointments in the office, your consent must be obtained to participate. Your consent will be active for this visit and any virtual visit you may have with one of our providers in the next 365 days. If you have a MyChart account, a copy of this consent can be sent to you electronically.  As this is a virtual visit, video technology does not allow for your provider to perform a traditional examination. This may limit your provider's ability to fully assess your condition. If your provider identifies any concerns that need to be evaluated in person or the need to arrange testing (such as labs, EKG, etc.), we will make arrangements to do so. Although advances in technology are sophisticated, we cannot ensure that it will always work on either your end or our end. If the connection with a video visit is poor, the visit may have to be switched to a telephone visit. With either a video or telephone visit, we are not always able to ensure that we have a secure connection.  By engaging in this virtual visit, you consent to the provision of healthcare and authorize for your insurance to be billed (if applicable) for the services provided during this visit. Depending on your insurance coverage, you may receive a charge related to this service.  I need to obtain your verbal consent now. Are you willing to proceed with your visit today? Kendra Shaw has provided verbal consent on 03/21/2024 for a virtual visit (video or telephone). Loa Lamp, FNP  Date: 03/21/2024 5:41 PM   Virtual Visit via Video Note   I, Loa Lamp, connected with  Kendra Shaw  ([id number][birth year]) on 03/21/2024 at  5:45 PM EST by a video-enabled telemedicine application and verified that I am speaking with the correct person using two identifiers.  Location: Patient: Virtual Visit Location Patient:  Home Provider: Virtual Visit Location Provider: Home Office   I discussed the limitations of evaluation and management by telemedicine and the availability of in person appointments. The patient expressed understanding and agreed to proceed.    History of Present Illness: Kendra Shaw is a 63 y.o. who identifies as a female who was assigned female at birth, and is being seen today for facial pressure and pain with headaches for 2 weeks. Exposed at work to illness. No fever now but had one earlier. Cough has improved. No wheezing or sob.   HPI: HPI  Problems:  Patient Active Problem List   Diagnosis Date Noted   Tobacco abuse 11/24/2021   Pulmonary nodule 10/06/2021   PCP NOTES >>>>>> 08/04/2021   Annual physical exam 08/04/2021   Menopausal symptom 07/20/2021   Reduced libido 07/20/2021    Allergies: Allergies[1] Medications: Current Medications[2]  Observations/Objective: Patient is well-developed, well-nourished in no acute distress.  Resting comfortably  at home.  Head is normocephalic, atraumatic.  No labored breathing.  Speech is clear and coherent with logical content.  Patient is alert and oriented at baseline.    Assessment and Plan: 1. Acute bacterial sinusitis (Primary)  Increase fluids, humidifier at night, tylenol or ibuprofen, UC as needed.   Follow Up Instructions: I discussed the assessment and treatment plan with the patient. The patient was provided an opportunity to ask questions and all were answered. The patient agreed with the plan and demonstrated an understanding of the instructions.  A copy  of instructions were sent to the patient via MyChart unless otherwise noted below.     The patient was advised to call back or seek an in-person evaluation if the symptoms worsen or if the condition fails to improve as anticipated.    Baneza Bartoszek, FNP     [1]  Allergies Allergen Reactions   Codeine Other (See Comments)   Neomycin-Bacitracin Zn-Polymyx      Other reaction(s): Other (See Comments)   Nitrofurantoin Other (See Comments)   Ofloxacin Other (See Comments)   Penicillins Other (See Comments)    Tolerates Amoxicillin  per patient   Propoxyphene Other (See Comments)  [2]  Current Outpatient Medications:    albuterol  (VENTOLIN  HFA) 108 (90 Base) MCG/ACT inhaler, Inhale 2 puffs into the lungs every 6 (six) hours as needed for wheezing or shortness of breath., Disp: 18 g, Rfl: 5   ALPRAZolam  (XANAX ) 0.25 MG tablet, Take 0.5-1 tablets (0.125-0.25 mg total) by mouth daily as needed., Disp: , Rfl:    cetirizine (ZYRTEC) 10 MG tablet, Take 10 mg by mouth daily., Disp: , Rfl:    estradiol (ESTRACE) 2 MG tablet, Take 2 mg by mouth daily., Disp: , Rfl:    Multiple Vitamins-Minerals (AIRBORNE GUMMIES PO), Take by mouth., Disp: , Rfl:    Probiotic Product (PROBIOTIC DAILY PO), Take by mouth., Disp: , Rfl:    testosterone cypionate (DEPOTESTOSTERONE CYPIONATE) 200 MG/ML injection, , Disp: , Rfl:    Vitamin D , Cholecalciferol, 25 MCG (1000 UT) CAPS, Take by mouth once a week., Disp: , Rfl:

## 2024-03-21 NOTE — Patient Instructions (Signed)

## 2024-03-21 NOTE — Telephone Encounter (Signed)
 FYI Only or Action Required?: FYI only for provider: Virtual UC.  Patient was last seen in primary care on 08/17/2023 by Amon Aloysius BRAVO, MD.  Called Nurse Triage reporting Sinusitis.  Symptoms began several days ago.  Interventions attempted: OTC medications: Tylenol, Zyrtec.  Symptoms are: gradually worsening.  Triage Disposition: Home Care  Patient/caregiver understands and will follow disposition?: No, wishes to speak with PCP  Copied from CRM #[id number]. Topic: Clinical - Red Word Triage >> Mar 21, 2024 12:38 PM [name] wrote: [facility] that prompted transfer to Nurse Triage: Patient is congested, having body aches and headaches. Thinks it is a Sinus Infection. Reason for Disposition  [1] Sinus congestion as part of a cold AND [2] present < 10 days  Answer Assessment - Initial Assessment Questions Beginning of the week, sinus pressure/congestion, headache, cough, and itching. Was messing around trying to clear some leaves from her yard. Taking Zyrtec and Tylenol.  Took husband to the ED yesterday when she got home she felt like she got hit by a ton of bricks. Body aches, sore throat, cough, congestion, low grade fever. She lay back down at 8am to try to rest some and slept till noon.   Denies CP, SOB, Dizziness-  Virtual UC appt made-- ED/UC precautions understood. She does not want to expose herself or others in case she is contagious and husband is immunocompromised.   1. LOCATION: Where does it hurt?      Around her eyes- sinus pressure  2. ONSET: When did the sinus pain start?  (e.g., hours, days)      Beginning of the week  3. SEVERITY: How bad is the pain?   (Scale 0-10; or none, mild, moderate or severe)     Sinus pain resolved 4. RECURRENT SYMPTOM: Have you ever had sinus problems before? If Yes, ask: When was the last time? and What happened that time?      Sinus infection in the Fall 5. NASAL CONGESTION: Is the nose blocked? If Yes, ask: Can you open it or  must you breathe through your mouth?     Bilaterally blocked 6. NASAL DISCHARGE: Do you have discharge from your nose? If so ask, What color?     UTA 7. FEVER: Do you have a fever? If Yes, ask: What is it, how was it measured, and when did it start?      99.7-- has been taking Tylenol so may have gotten higher at one point 8. OTHER SYMPTOMS: Do you have any other symptoms? (e.g., sore throat, cough, earache, difficulty breathing)     Sore throat, fever, cough  Protocols used: Sinus Pain or Congestion-A-AH

## 2024-04-12 ENCOUNTER — Telehealth: Admitting: Nurse Practitioner

## 2024-04-12 DIAGNOSIS — B9689 Other specified bacterial agents as the cause of diseases classified elsewhere: Secondary | ICD-10-CM | POA: Diagnosis not present

## 2024-04-12 DIAGNOSIS — J019 Acute sinusitis, unspecified: Secondary | ICD-10-CM | POA: Diagnosis not present

## 2024-04-12 MED ORDER — BENZONATATE 200 MG PO CAPS: 200.0000 mg | ORAL_CAPSULE | Freq: Two times a day (BID) | ORAL | 0 refills | Status: AC | PRN

## 2024-04-12 MED ORDER — AMOXICILLIN-POT CLAVULANATE 875-125 MG PO TABS: 1.0000 | ORAL_TABLET | Freq: Two times a day (BID) | ORAL | 0 refills | Status: AC

## 2024-04-12 NOTE — Progress Notes (Signed)
 " Virtual Visit Consent   Kendra Shaw, you are scheduled for a virtual visit with a [facility] provider today. Just as with appointments in the office, your consent must be obtained to participate. Your consent will be active for this visit and any virtual visit you may have with one of our providers in the next 365 days. If you have a MyChart account, a copy of this consent can be sent to you electronically.  As this is a virtual visit, video technology does not allow for your provider to perform a traditional examination. This may limit your provider's ability to fully assess your condition. If your provider identifies any concerns that need to be evaluated in person or the need to arrange testing (such as labs, EKG, etc.), we will make arrangements to do so. Although advances in technology are sophisticated, we cannot ensure that it will always work on either your end or our end. If the connection with a video visit is poor, the visit may have to be switched to a telephone visit. With either a video or telephone visit, we are not always able to ensure that we have a secure connection.  By engaging in this virtual visit, you consent to the provision of healthcare and authorize for your insurance to be billed (if applicable) for the services provided during this visit. Depending on your insurance coverage, you may receive a charge related to this service.  I need to obtain your verbal consent now. Are you willing to proceed with your visit today? Kendra Shaw has provided verbal consent on 04/12/2024 for a virtual visit (video or telephone). [name], NP  Date: 04/12/2024 7:09 PM   Virtual Visit via Video Note   I, [name], connected with  Kendra Shaw  ([id number], Sep 11, 1960) on 04/12/2024 at  7:00 PM EST by a video-enabled telemedicine application and verified that I am speaking with the correct person using two identifiers.  Location: Patient: Virtual Visit Location Patient:  Home Provider: Virtual Visit Location Provider: Home Office   I discussed the limitations of evaluation and management by telemedicine and the availability of in person appointments. The patient expressed understanding and agreed to proceed.    History of Present Illness: Kendra Shaw is a 64 y.o. who identifies as a female who was assigned female at birth, and is being seen today for recurrent sinusitis.  Ms Viviani has a partner who is currently in the hospital with flu and COVID. He was admitted 2 days ago.  Recently Ms. sherrie started experiencing fever, cough, diarrhea and fatigue.  She is taking Robitussin cough syrup over-the-counter with no relief of her symptoms.  She has taken several COVID test at home however they are all expired.  She was treated for sinusitis in December however that time she was only prescribed amoxicillin .  She is also a current smoker   Problems:  Patient Active Problem List   Diagnosis Date Noted   Tobacco abuse 11/24/2021   Pulmonary nodule 10/06/2021   PCP NOTES >>>>>> 08/04/2021   Annual physical exam 08/04/2021   Menopausal symptom 07/20/2021   Reduced libido 07/20/2021    Allergies: Allergies[1] Medications: Current Medications[2]  Observations/Objective: Patient is well-developed, well-nourished in no acute distress.  Resting comfortably at home.  Head is normocephalic, atraumatic.  No labored breathing.  Speech is clear and coherent with logical content.  Patient is alert and oriented at baseline.    Assessment and Plan: 1. Acute bacterial sinusitis (  Primary) - benzonatate  (TESSALON ) 200 MG capsule; Take 1 capsule (200 mg total) by mouth 2 (two) times daily as needed for cough.  Dispense: 20 capsule; Refill: 0 - amoxicillin -clavulanate (AUGMENTIN ) 875-125 MG tablet; Take 1 tablet by mouth 2 (two) times daily for 7 days.  Dispense: 14 tablet; Refill: 0    Follow Up Instructions: I discussed the assessment and treatment plan with the  patient. The patient was provided an opportunity to ask questions and all were answered. The patient agreed with the plan and demonstrated an understanding of the instructions.  A copy of instructions were sent to the patient via MyChart unless otherwise noted below.    The patient was advised to call back or seek an in-person evaluation if the symptoms worsen or if the condition fails to improve as anticipated.    Menucha Dicesare W Ladarious Kresse, NP      [1]  Allergies Allergen Reactions   Codeine Other (See Comments)   Neomycin-Bacitracin Zn-Polymyx     Other reaction(s): Other (See Comments)   Nitrofurantoin Other (See Comments)   Ofloxacin Other (See Comments)   Penicillins Other (See Comments)    Tolerates Amoxicillin  per patient   Propoxyphene Other (See Comments)  [2]  Current Outpatient Medications:    amoxicillin -clavulanate (AUGMENTIN ) 875-125 MG tablet, Take 1 tablet by mouth 2 (two) times daily for 7 days., Disp: 14 tablet, Rfl: 0   benzonatate  (TESSALON ) 200 MG capsule, Take 1 capsule (200 mg total) by mouth 2 (two) times daily as needed for cough., Disp: 20 capsule, Rfl: 0   albuterol  (VENTOLIN  HFA) 108 (90 Base) MCG/ACT inhaler, Inhale 2 puffs into the lungs every 6 (six) hours as needed for wheezing or shortness of breath., Disp: 18 g, Rfl: 5   ALPRAZolam  (XANAX ) 0.25 MG tablet, Take 0.5-1 tablets (0.125-0.25 mg total) by mouth daily as needed., Disp: , Rfl:    cetirizine (ZYRTEC) 10 MG tablet, Take 10 mg by mouth daily., Disp: , Rfl:    estradiol (ESTRACE) 2 MG tablet, Take 2 mg by mouth daily., Disp: , Rfl:    Multiple Vitamins-Minerals (AIRBORNE GUMMIES PO), Take by mouth., Disp: , Rfl:    Probiotic Product (PROBIOTIC DAILY PO), Take by mouth., Disp: , Rfl:    testosterone cypionate (DEPOTESTOSTERONE CYPIONATE) 200 MG/ML injection, , Disp: , Rfl:    Vitamin D , Cholecalciferol, 25 MCG (1000 UT) CAPS, Take by mouth once a week., Disp: , Rfl:   "

## 2024-04-12 NOTE — Patient Instructions (Signed)
 " Kendra Shaw, thank you for joining Kendra LELON Servant, NP for today's virtual visit.  While this provider is not your primary care provider (PCP), if your PCP is located in our provider database this encounter information will be shared with them immediately following your visit.   A [facility] account gives you access to today's visit and all your visits, tests, and labs performed at [facility]  click here if you don't have a [facility] account or go to [email or web address]  Consent: (Patient) Kendra Shaw provided verbal consent for this virtual visit at the beginning of the encounter.  Current Medications:  Current Outpatient Medications:    amoxicillin -clavulanate (AUGMENTIN ) 875-125 MG tablet, Take 1 tablet by mouth 2 (two) times daily for 7 days., Disp: 14 tablet, Rfl: 0   benzonatate  (TESSALON ) 200 MG capsule, Take 1 capsule (200 mg total) by mouth 2 (two) times daily as needed for cough., Disp: 20 capsule, Rfl: 0   albuterol  (VENTOLIN  HFA) 108 (90 Base) MCG/ACT inhaler, Inhale 2 puffs into the lungs every 6 (six) hours as needed for wheezing or shortness of breath., Disp: 18 g, Rfl: 5   ALPRAZolam  (XANAX ) 0.25 MG tablet, Take 0.5-1 tablets (0.125-0.25 mg total) by mouth daily as needed., Disp: , Rfl:    cetirizine (ZYRTEC) 10 MG tablet, Take 10 mg by mouth daily., Disp: , Rfl:    estradiol (ESTRACE) 2 MG tablet, Take 2 mg by mouth daily., Disp: , Rfl:    Multiple Vitamins-Minerals (AIRBORNE GUMMIES PO), Take by mouth., Disp: , Rfl:    Probiotic Product (PROBIOTIC DAILY PO), Take by mouth., Disp: , Rfl:    testosterone cypionate (DEPOTESTOSTERONE CYPIONATE) 200 MG/ML injection, , Disp: , Rfl:    Vitamin D , Cholecalciferol, 25 MCG (1000 UT) CAPS, Take by mouth once a week., Disp: , Rfl:    Medications ordered in this encounter:  Meds ordered this encounter  Medications   benzonatate  (TESSALON ) 200 MG capsule    Sig: Take 1 capsule (200 mg total)  by mouth 2 (two) times daily as needed for cough.    Dispense:  20 capsule    Refill:  0    Supervising Provider:   LAMPTEY, PHILIP Shaw [[id number]]   amoxicillin -clavulanate (AUGMENTIN ) 875-125 MG tablet    Sig: Take 1 tablet by mouth 2 (two) times daily for 7 days.    Dispense:  14 tablet    Refill:  0    Supervising Provider:   BLAISE ALEENE [facility] [[id number]]     *If you need refills on other medications prior to your next appointment, please contact your pharmacy*  Follow-Up: Call back or seek an in-person evaluation if the symptoms worsen or if the condition fails to improve as anticipated.  [facility] [phone]  Other Instructions INSTRUCTIONS: use a humidifier for nasal congestion Drink plenty of fluids, rest and wash hands frequently to avoid the spread of infection Alternate tylenol and Motrin for relief of fever    If you have been instructed to have an in-person evaluation today at a local Urgent Care facility, please use the link below. It will take you to a list of all of our available [facility], including address, phone number and hours of operation. Please do not delay care.  [facility]  If you or a family member do not have a primary care provider, use the link below to schedule a visit and establish care. When you choose  a [facility] primary care physician or advanced practice provider, you gain a long-term partner in health. Find a Primary Care Provider  Learn more about [facility]'s in-office and virtual care options: [facility] Get [facility] Now  "
